# Patient Record
Sex: Female | Born: 1965
Health system: Southern US, Community
[De-identification: ages and names within clinical notes are randomized; demographics above are authoritative.]

## PROBLEM LIST (undated history)

## (undated) DIAGNOSIS — T8859XA Other complications of anesthesia, initial encounter: Secondary | ICD-10-CM

## (undated) DIAGNOSIS — I10 Essential (primary) hypertension: Secondary | ICD-10-CM

## (undated) DIAGNOSIS — J189 Pneumonia, unspecified organism: Secondary | ICD-10-CM

## (undated) DIAGNOSIS — T4145XA Adverse effect of unspecified anesthetic, initial encounter: Secondary | ICD-10-CM

## (undated) DIAGNOSIS — T7840XA Allergy, unspecified, initial encounter: Secondary | ICD-10-CM

## (undated) DIAGNOSIS — R7611 Nonspecific reaction to tuberculin skin test without active tuberculosis: Secondary | ICD-10-CM

## (undated) DIAGNOSIS — F419 Anxiety disorder, unspecified: Secondary | ICD-10-CM

## (undated) DIAGNOSIS — D72829 Elevated white blood cell count, unspecified: Secondary | ICD-10-CM

## (undated) DIAGNOSIS — R112 Nausea with vomiting, unspecified: Secondary | ICD-10-CM

## (undated) DIAGNOSIS — Z9889 Other specified postprocedural states: Secondary | ICD-10-CM

## (undated) HISTORY — DX: Elevated white blood cell count, unspecified: D72.829

## (undated) HISTORY — PX: BIOPSY BREAST: PRO8

## (undated) HISTORY — DX: Essential (primary) hypertension: I10

## (undated) HISTORY — DX: Nonspecific reaction to tuberculin skin test without active tuberculosis: R76.11

## (undated) HISTORY — DX: Allergy, unspecified, initial encounter: T78.40XA

## (undated) HISTORY — DX: Anxiety disorder, unspecified: F41.9

---
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
## 1997-11-30 ENCOUNTER — Other Ambulatory Visit: Admission: RE | Admit: 1997-11-30 | Discharge: 1997-11-30 | Payer: Self-pay | Admitting: Obstetrics and Gynecology

## 1998-04-22 ENCOUNTER — Encounter: Admission: RE | Admit: 1998-04-22 | Discharge: 1998-04-22 | Payer: Self-pay | Admitting: Infectious Diseases

## 1998-04-22 ENCOUNTER — Ambulatory Visit (HOSPITAL_COMMUNITY): Admission: RE | Admit: 1998-04-22 | Discharge: 1998-04-22 | Payer: Self-pay | Admitting: Infectious Diseases

## 1998-04-22 ENCOUNTER — Encounter: Payer: Self-pay | Admitting: Infectious Diseases

## 1998-12-03 ENCOUNTER — Other Ambulatory Visit: Admission: RE | Admit: 1998-12-03 | Discharge: 1998-12-03 | Payer: Self-pay | Admitting: Obstetrics and Gynecology

## 1999-12-30 ENCOUNTER — Other Ambulatory Visit: Admission: RE | Admit: 1999-12-30 | Discharge: 1999-12-30 | Payer: Self-pay | Admitting: Obstetrics and Gynecology

## 2001-07-18 ENCOUNTER — Other Ambulatory Visit: Admission: RE | Admit: 2001-07-18 | Discharge: 2001-07-18 | Payer: Self-pay | Admitting: *Deleted

## 2002-05-11 ENCOUNTER — Inpatient Hospital Stay (HOSPITAL_COMMUNITY): Admission: AD | Admit: 2002-05-11 | Discharge: 2002-05-13 | Payer: Self-pay | Admitting: Obstetrics and Gynecology

## 2002-06-17 ENCOUNTER — Other Ambulatory Visit: Admission: RE | Admit: 2002-06-17 | Discharge: 2002-06-17 | Payer: Self-pay | Admitting: Obstetrics and Gynecology

## 2003-07-08 ENCOUNTER — Other Ambulatory Visit: Admission: RE | Admit: 2003-07-08 | Discharge: 2003-07-08 | Payer: Self-pay | Admitting: *Deleted

## 2004-07-13 ENCOUNTER — Other Ambulatory Visit: Admission: RE | Admit: 2004-07-13 | Discharge: 2004-07-13 | Payer: Self-pay | Admitting: *Deleted

## 2004-09-07 ENCOUNTER — Ambulatory Visit: Payer: Self-pay | Admitting: Family Medicine

## 2004-09-15 ENCOUNTER — Ambulatory Visit: Payer: Self-pay | Admitting: Family Medicine

## 2004-10-10 ENCOUNTER — Ambulatory Visit: Payer: Self-pay | Admitting: Family Medicine

## 2004-11-18 ENCOUNTER — Ambulatory Visit: Payer: Self-pay | Admitting: Family Medicine

## 2004-12-23 ENCOUNTER — Ambulatory Visit: Payer: Self-pay | Admitting: Family Medicine

## 2005-04-10 ENCOUNTER — Ambulatory Visit: Payer: Self-pay | Admitting: Internal Medicine

## 2005-07-19 ENCOUNTER — Other Ambulatory Visit: Admission: RE | Admit: 2005-07-19 | Discharge: 2005-07-19 | Payer: Self-pay | Admitting: *Deleted

## 2005-08-08 ENCOUNTER — Emergency Department (HOSPITAL_COMMUNITY): Admission: EM | Admit: 2005-08-08 | Discharge: 2005-08-09 | Payer: Self-pay | Admitting: Emergency Medicine

## 2006-01-30 ENCOUNTER — Ambulatory Visit: Payer: Self-pay | Admitting: Family Medicine

## 2006-01-30 LAB — CONVERTED CEMR LAB
ALT: 12 units/L (ref 0–40)
AST: 17 units/L (ref 0–37)
Albumin: 3.6 g/dL (ref 3.5–5.2)
Alkaline Phosphatase: 56 units/L (ref 39–117)
BUN: 8 mg/dL (ref 6–23)
CO2: 24 meq/L (ref 19–32)
Calcium: 9.2 mg/dL (ref 8.4–10.5)
Chloride: 106 meq/L (ref 96–112)
Chol/HDL Ratio, serum: 3.2
Cholesterol: 148 mg/dL (ref 0–200)
Creatinine, Ser: 0.8 mg/dL (ref 0.4–1.2)
Free T4: 0.8 ng/dL — ABNORMAL LOW (ref 0.9–1.8)
GFR calc non Af Amer: 85 mL/min
Glomerular Filtration Rate, Af Am: 103 mL/min/{1.73_m2}
Glucose, Bld: 93 mg/dL (ref 70–99)
HCT: 39.5 % (ref 36.0–46.0)
HDL: 46.3 mg/dL (ref >39.0)
Hemoglobin: 13.8 g/dL (ref 12.0–15.0)
LDL Cholesterol: 85 mg/dL (ref 0–99)
MCHC: 34.9 g/dL (ref 30.0–36.0)
MCV: 87.1 fL (ref 78.0–100.0)
Platelets: 441 10*3/uL — ABNORMAL HIGH (ref 150–400)
Potassium: 4.1 meq/L (ref 3.5–5.1)
RBC: 4.54 M/uL (ref 3.87–5.11)
RDW: 11.7 % (ref 11.5–14.6)
Sodium: 138 meq/L (ref 135–145)
TSH: 0.8 microintl units/mL (ref 0.35–5.50)
Total Bilirubin: 0.5 mg/dL (ref 0.3–1.2)
Total Protein: 6.9 g/dL (ref 6.0–8.3)
Triglyceride fasting, serum: 84 mg/dL (ref 0–149)
VLDL: 17 mg/dL (ref 0–40)
WBC: 15.7 10*3/uL — ABNORMAL HIGH (ref 4.5–10.5)

## 2006-07-30 ENCOUNTER — Ambulatory Visit: Payer: Self-pay | Admitting: Family Medicine

## 2006-08-02 ENCOUNTER — Other Ambulatory Visit: Admission: RE | Admit: 2006-08-02 | Discharge: 2006-08-02 | Payer: Self-pay | Admitting: *Deleted

## 2006-10-23 DIAGNOSIS — I10 Essential (primary) hypertension: Secondary | ICD-10-CM

## 2006-10-23 DIAGNOSIS — J309 Allergic rhinitis, unspecified: Secondary | ICD-10-CM | POA: Insufficient documentation

## 2006-10-23 DIAGNOSIS — Z9189 Other specified personal risk factors, not elsewhere classified: Secondary | ICD-10-CM

## 2006-10-23 DIAGNOSIS — Z87448 Personal history of other diseases of urinary system: Secondary | ICD-10-CM

## 2007-02-26 ENCOUNTER — Ambulatory Visit: Payer: Self-pay | Admitting: Family Medicine

## 2007-03-06 ENCOUNTER — Ambulatory Visit: Payer: Self-pay | Admitting: Family Medicine

## 2007-03-06 DIAGNOSIS — F411 Generalized anxiety disorder: Secondary | ICD-10-CM

## 2007-03-06 DIAGNOSIS — D72829 Elevated white blood cell count, unspecified: Secondary | ICD-10-CM | POA: Insufficient documentation

## 2007-03-07 ENCOUNTER — Ambulatory Visit: Payer: Self-pay | Admitting: Hematology & Oncology

## 2007-03-11 ENCOUNTER — Encounter: Payer: Self-pay | Admitting: Family Medicine

## 2007-03-11 LAB — CBC WITH DIFFERENTIAL/PLATELET
Basophils Absolute: 0.3 10*3/uL — ABNORMAL HIGH (ref 0.0–0.1)
EOS%: 2.5 % (ref 0.0–7.0)
HCT: 36.5 % (ref 34.8–46.6)
HGB: 13.1 g/dL (ref 11.6–15.9)
MCH: 30.9 pg (ref 26.0–34.0)
MCV: 86.2 fL (ref 81.0–101.0)
MONO%: 3.9 % (ref 0.0–13.0)
NEUT%: 55.7 % (ref 39.6–76.8)
lymph#: 4.9 10*3/uL — ABNORMAL HIGH (ref 0.9–3.3)

## 2007-03-11 LAB — ERYTHROCYTE SEDIMENTATION RATE: Sed Rate: 5 mm/hr (ref 0–30)

## 2007-03-26 LAB — JAK2 GENOTYPR: JAK2 GenotypR: NEGATIVE

## 2007-05-29 ENCOUNTER — Ambulatory Visit: Payer: Self-pay | Admitting: Family Medicine

## 2007-05-29 DIAGNOSIS — J019 Acute sinusitis, unspecified: Secondary | ICD-10-CM | POA: Insufficient documentation

## 2007-08-07 ENCOUNTER — Other Ambulatory Visit: Admission: RE | Admit: 2007-08-07 | Discharge: 2007-08-07 | Payer: Self-pay | Admitting: Gynecology

## 2007-09-09 ENCOUNTER — Ambulatory Visit: Payer: Self-pay | Admitting: Hematology & Oncology

## 2007-09-11 ENCOUNTER — Encounter: Payer: Self-pay | Admitting: Family Medicine

## 2007-09-11 LAB — CBC WITH DIFFERENTIAL/PLATELET
Basophils Absolute: 0.2 10*3/uL — ABNORMAL HIGH (ref 0.0–0.1)
Eosinophils Absolute: 0.6 10*3/uL — ABNORMAL HIGH (ref 0.0–0.5)
HCT: 38.5 % (ref 34.8–46.6)
LYMPH%: 30.5 % (ref 14.0–48.0)
MONO#: 0.7 10*3/uL (ref 0.1–0.9)
NEUT#: 8.7 10*3/uL — ABNORMAL HIGH (ref 1.5–6.5)
NEUT%: 59.2 % (ref 39.6–76.8)
Platelets: 392 10*3/uL (ref 145–400)
WBC: 14.6 10*3/uL — ABNORMAL HIGH (ref 3.9–10.0)

## 2008-04-09 ENCOUNTER — Ambulatory Visit: Payer: Self-pay | Admitting: Hematology & Oncology

## 2008-04-24 ENCOUNTER — Encounter: Payer: Self-pay | Admitting: Family Medicine

## 2008-04-24 LAB — CBC WITH DIFFERENTIAL (CANCER CENTER ONLY)
BASO#: 0.1 10*3/uL (ref 0.0–0.2)
EOS%: 3.9 % (ref 0.0–7.0)
HCT: 38.3 % (ref 34.8–46.6)
HGB: 13 g/dL (ref 11.6–15.9)
LYMPH#: 3.3 10*3/uL (ref 0.9–3.3)
MONO#: 0.6 10*3/uL (ref 0.1–0.9)
NEUT#: 6.5 10*3/uL (ref 1.5–6.5)
NEUT%: 59.2 % (ref 39.6–80.0)
RBC: 4.5 10*6/uL (ref 3.70–5.32)
WBC: 10.9 10*3/uL — ABNORMAL HIGH (ref 3.9–10.0)

## 2008-08-10 ENCOUNTER — Ambulatory Visit: Payer: Self-pay | Admitting: Family Medicine

## 2008-08-10 DIAGNOSIS — F411 Generalized anxiety disorder: Secondary | ICD-10-CM | POA: Insufficient documentation

## 2008-08-14 ENCOUNTER — Telehealth: Payer: Self-pay | Admitting: Family Medicine

## 2008-08-18 ENCOUNTER — Ambulatory Visit: Payer: Self-pay | Admitting: Family Medicine

## 2008-08-18 DIAGNOSIS — R000 Tachycardia, unspecified: Secondary | ICD-10-CM

## 2008-09-25 ENCOUNTER — Encounter: Payer: Self-pay | Admitting: Family Medicine

## 2008-10-30 ENCOUNTER — Telehealth (INDEPENDENT_AMBULATORY_CARE_PROVIDER_SITE_OTHER): Payer: Self-pay | Admitting: *Deleted

## 2008-10-30 ENCOUNTER — Telehealth: Payer: Self-pay | Admitting: Family Medicine

## 2008-12-22 ENCOUNTER — Ambulatory Visit: Payer: Self-pay | Admitting: Family Medicine

## 2008-12-22 DIAGNOSIS — H1045 Other chronic allergic conjunctivitis: Secondary | ICD-10-CM

## 2009-02-11 ENCOUNTER — Ambulatory Visit: Payer: Self-pay | Admitting: Family Medicine

## 2009-02-11 DIAGNOSIS — R05 Cough: Secondary | ICD-10-CM

## 2009-02-11 DIAGNOSIS — J209 Acute bronchitis, unspecified: Secondary | ICD-10-CM

## 2009-05-26 ENCOUNTER — Ambulatory Visit: Payer: Self-pay | Admitting: Family Medicine

## 2009-05-26 DIAGNOSIS — N3000 Acute cystitis without hematuria: Secondary | ICD-10-CM

## 2009-05-26 DIAGNOSIS — E663 Overweight: Secondary | ICD-10-CM | POA: Insufficient documentation

## 2009-05-26 LAB — CONVERTED CEMR LAB
Bilirubin Urine: NEGATIVE
Glucose, Urine, Semiquant: NEGATIVE
Ketones, urine, test strip: NEGATIVE
Nitrite: NEGATIVE
Protein, U semiquant: NEGATIVE
Specific Gravity, Urine: 1.01
Urobilinogen, UA: 0.2
pH: 6

## 2009-07-09 ENCOUNTER — Ambulatory Visit: Payer: Self-pay | Admitting: Family Medicine

## 2009-07-09 LAB — CONVERTED CEMR LAB
Bilirubin Urine: NEGATIVE
Blood in Urine, dipstick: NEGATIVE
Glucose, Urine, Semiquant: NEGATIVE
Ketones, urine, test strip: NEGATIVE
Nitrite: NEGATIVE
Protein, U semiquant: NEGATIVE
Specific Gravity, Urine: 1.015
Urobilinogen, UA: 0.2
WBC Urine, dipstick: NEGATIVE
pH: 8.5

## 2009-07-13 LAB — CONVERTED CEMR LAB
ALT: 28 units/L (ref 0–35)
AST: 28 units/L (ref 0–37)
Albumin: 4.1 g/dL (ref 3.5–5.2)
Alkaline Phosphatase: 65 units/L (ref 39–117)
BUN: 10 mg/dL (ref 6–23)
Basophils Absolute: 0.1 10*3/uL (ref 0.0–0.1)
Basophils Relative: 0.9 % (ref 0.0–3.0)
Bilirubin, Direct: 0.1 mg/dL (ref 0.0–0.3)
CO2: 27 meq/L (ref 19–32)
Calcium: 9 mg/dL (ref 8.4–10.5)
Chloride: 104 meq/L (ref 96–112)
Cholesterol: 133 mg/dL (ref 0–200)
Creatinine, Ser: 0.7 mg/dL (ref 0.4–1.2)
Eosinophils Absolute: 0.4 10*3/uL (ref 0.0–0.7)
Eosinophils Relative: 3.8 % (ref 0.0–5.0)
GFR calc non Af Amer: 97.15 mL/min (ref >60)
Glucose, Bld: 99 mg/dL (ref 70–99)
HCT: 40.1 % (ref 36.0–46.0)
HDL: 44.4 mg/dL (ref >39.00)
Hemoglobin: 13.8 g/dL (ref 12.0–15.0)
LDL Cholesterol: 76 mg/dL (ref 0–99)
Lymphocytes Relative: 33.3 % (ref 12.0–46.0)
Lymphs Abs: 3.7 10*3/uL (ref 0.7–4.0)
MCHC: 34.4 g/dL (ref 30.0–36.0)
MCV: 89.9 fL (ref 78.0–100.0)
Monocytes Absolute: 0.7 10*3/uL (ref 0.1–1.0)
Monocytes Relative: 6.3 % (ref 3.0–12.0)
Neutro Abs: 6.2 10*3/uL (ref 1.4–7.7)
Neutrophils Relative %: 55.7 % (ref 43.0–77.0)
Platelets: 391 10*3/uL (ref 150.0–400.0)
Potassium: 4.1 meq/L (ref 3.5–5.1)
RBC: 4.46 M/uL (ref 3.87–5.11)
RDW: 13.2 % (ref 11.5–14.6)
Sodium: 139 meq/L (ref 135–145)
TSH: 1.42 microintl units/mL (ref 0.35–5.50)
Total Bilirubin: 0.7 mg/dL (ref 0.3–1.2)
Total CHOL/HDL Ratio: 3
Total Protein: 6.8 g/dL (ref 6.0–8.3)
Triglycerides: 63 mg/dL (ref 0.0–149.0)
VLDL: 12.6 mg/dL (ref 0.0–40.0)
WBC: 11.2 10*3/uL — ABNORMAL HIGH (ref 4.5–10.5)

## 2009-07-16 ENCOUNTER — Ambulatory Visit: Payer: Self-pay | Admitting: Family Medicine

## 2009-09-27 ENCOUNTER — Telehealth: Payer: Self-pay | Admitting: Family Medicine

## 2009-09-28 ENCOUNTER — Encounter: Payer: Self-pay | Admitting: Family Medicine

## 2009-10-27 ENCOUNTER — Encounter: Payer: Self-pay | Admitting: Family Medicine

## 2010-05-01 LAB — CONVERTED CEMR LAB
ALT: 12 units/L (ref 0–35)
AST: 23 units/L (ref 0–37)
Albumin: 3.4 g/dL — ABNORMAL LOW (ref 3.5–5.2)
Alkaline Phosphatase: 73 units/L (ref 39–117)
BUN: 14 mg/dL (ref 6–23)
Basophils Absolute: 0.1 10*3/uL (ref 0.0–0.1)
Basophils Relative: 0.5 % (ref 0.0–3.0)
Bilirubin, Direct: 0.1 mg/dL (ref 0.0–0.3)
CO2: 27 meq/L (ref 19–32)
Calcium: 9 mg/dL (ref 8.4–10.5)
Chloride: 103 meq/L (ref 96–112)
Creatinine, Ser: 0.7 mg/dL (ref 0.4–1.2)
Eosinophils Absolute: 0.2 10*3/uL (ref 0.0–0.7)
Eosinophils Relative: 1.5 % (ref 0.0–5.0)
GFR calc non Af Amer: 97.57 mL/min (ref >60)
Glucose, Bld: 116 mg/dL — ABNORMAL HIGH (ref 70–99)
HCT: 39.3 % (ref 36.0–46.0)
Hemoglobin: 13.8 g/dL (ref 12.0–15.0)
Lymphocytes Relative: 34.7 % (ref 12.0–46.0)
Lymphs Abs: 4.5 10*3/uL — ABNORMAL HIGH (ref 0.7–4.0)
MCHC: 35.1 g/dL (ref 30.0–36.0)
MCV: 87 fL (ref 78.0–100.0)
Monocytes Absolute: 0.5 10*3/uL (ref 0.1–1.0)
Monocytes Relative: 3.6 % (ref 3.0–12.0)
Neutro Abs: 7.6 10*3/uL (ref 1.4–7.7)
Neutrophils Relative %: 59.7 % (ref 43.0–77.0)
Platelets: 375 10*3/uL (ref 150.0–400.0)
Potassium: 3.3 meq/L — ABNORMAL LOW (ref 3.5–5.1)
RBC: 4.52 M/uL (ref 3.87–5.11)
RDW: 11.9 % (ref 11.5–14.6)
Sodium: 137 meq/L (ref 135–145)
TSH: 1.56 microintl units/mL (ref 0.35–5.50)
Total Bilirubin: 0.5 mg/dL (ref 0.3–1.2)
Total Protein: 6.9 g/dL (ref 6.0–8.3)
WBC: 12.9 10*3/uL — ABNORMAL HIGH (ref 4.5–10.5)

## 2010-05-05 NOTE — Progress Notes (Signed)
Summary: swelling  Phone Note Call from Patient   Caller: Patient Call For: Nelwyn Salisbury MD Summary of Call: Pt is complaining of swelling on Amlodipine and would like a diuretic. [facility] [phone]  Initial call taken by: Lynann Beaver CMA,  September 27, 2009 3:02 PM  Follow-up for Phone Call        call in HCTZ 12.5 mg once daily , #30 with 11 rf Follow-up by: Nelwyn Salisbury MD,  September 27, 2009 5:09 PM    New/Updated Medications: HYDROCHLOROTHIAZIDE 12.5 MG CAPS (HYDROCHLOROTHIAZIDE) 1 once daily Prescriptions: HYDROCHLOROTHIAZIDE 12.5 MG CAPS (HYDROCHLOROTHIAZIDE) 1 once daily  #30 x 11   Entered by:   Raechel Ache, RN   Authorized by:   Nelwyn Salisbury MD   Signed by:   Raechel Ache, RN on 09/27/2009   Method used:   Electronically to        [facility]* (retail)       [address] [place], [place]  [address]       Ph: [phone] or [phone]       Fax: [phone]   RxID:   [id number]

## 2010-05-05 NOTE — Assessment & Plan Note (Signed)
Summary: ? UTI//CCM   Vital Signs:  Patient profile:   45 year old female Weight:      188 pounds BMI:     34.51 Temp:     98.5 degrees F oral Pulse rate:   89 / minute BP sitting:   122 / 84  (left arm) Cuff size:   large  Vitals Entered By: Alfred Levins, CMA (May 26, 2009 3:23 PM) CC: uti, bp check   History of Present Illness: Here to check BP, to ask about weight loss options, and for a possible UTI. For 3 days she has had urgency to urinate with some lower back pressure. No fever or nausea. Drinking water. She is dieting and walking every day but cannot lose weight. Would like to try an appetite suppressant.   Current Medications (verified): 1)  Yaz 3-0.02 Mg  Tabs (Drospirenone-Ethinyl Estradiol) .Marland Kitchen.. 1 By Mouth Once Daily 2)  Rhinocort Aqua 32 Mcg/act Susp (Budesonide (Nasal)) .... 2 Sprays Each Nostril Once Daily 3)  Multivitamins   Tabs (Multiple Vitamin) .Marland Kitchen.. 1 By Mouth Once Daily 4)  Celexa 40 Mg Tabs (Citalopram Hydrobromide) .... Once Daily 5)  Klor-Con M20 20 Meq Cr-Tabs (Potassium Chloride Crys Cr) .Marland Kitchen.. 1 By Mouth Once Daily 6)  Flonase 50 Mcg/act Susp (Fluticasone Propionate) .... 2 Sprays Each Nostril Once Daily 7)  Amlodipine Besylate 5 Mg Tabs (Amlodipine Besylate) .... Once Daily  Allergies (verified): 1)  ! Lisinopril  Past History:  Past Medical History: Reviewed history from 08/10/2008 and no changes required. Allergic rhinitis Hypertension benign chronic leukocytosis, saw Dr. Myna Hidalgo. No follow up needed.  Anxiety  Review of Systems  The patient denies anorexia, fever, weight loss, weight gain, vision loss, decreased hearing, hoarseness, chest pain, syncope, dyspnea on exertion, peripheral edema, prolonged cough, headaches, hemoptysis, abdominal pain, melena, hematochezia, severe indigestion/heartburn, hematuria, incontinence, genital sores, muscle weakness, suspicious skin lesions, transient blindness, difficulty walking, depression, unusual  weight change, abnormal bleeding, enlarged lymph nodes, angioedema, breast masses, and testicular masses.    Physical Exam  General:  overweight-appearing.   Neck:  No deformities, masses, or tenderness noted. Lungs:  Normal respiratory effort, chest expands symmetrically. Lungs are clear to auscultation, no crackles or wheezes. Heart:  Normal rate and regular rhythm. S1 and S2 normal without gallop, murmur, click, rub or other extra sounds. Abdomen:  Bowel sounds positive,abdomen soft and non-tender without masses, organomegaly or hernias noted.   Impression & Recommendations:  Problem # 1:  ACUTE CYSTITIS (ICD-595.0)  Her updated medication list for this problem includes:    Macrobid 100 Mg Caps (Nitrofurantoin monohyd macro) .Marland Kitchen..Marland Kitchen Two times a day  Problem # 2:  OVERWEIGHT (ICD-278.02)  Problem # 3:  HYPERTENSION (ICD-401.9)  Her updated medication list for this problem includes:    Amlodipine Besylate 5 Mg Tabs (Amlodipine besylate) ..... Once daily  Complete Medication List: 1)  Yaz 3-0.02 Mg Tabs (Drospirenone-ethinyl estradiol) .Marland Kitchen.. 1 by mouth once daily 2)  Rhinocort Aqua 32 Mcg/act Susp (Budesonide (nasal)) .... 2 sprays each nostril once daily 3)  Multivitamins Tabs (Multiple vitamin) .Marland Kitchen.. 1 by mouth once daily 4)  Celexa 40 Mg Tabs (Citalopram hydrobromide) .... Once daily 5)  Klor-con M20 20 Meq Cr-tabs (Potassium chloride crys cr) .Marland Kitchen.. 1 by mouth once daily 6)  Flonase 50 Mcg/act Susp (Fluticasone propionate) .... 2 sprays each nostril once daily 7)  Amlodipine Besylate 5 Mg Tabs (Amlodipine besylate) .... Once daily 8)  Macrobid 100 Mg Caps (Nitrofurantoin monohyd macro) .... Two times  a day 9)  Phentermine Hcl 30 Mg Caps (Phentermine hcl) .... Once daily  Other Orders: UA Dipstick w/o Micro (manual) (04540)  Patient Instructions: 1)  set up a cpx with labs soon Prescriptions: MACROBID 100 MG CAPS (NITROFURANTOIN MONOHYD MACRO) two times a day  #14 x 0    Entered and Authorized by:   Nelwyn Salisbury MD   Signed by:   Nelwyn Salisbury MD on 05/26/2009   Method used:   Print then Give to Patient   RxID:   [id number] PHENTERMINE HCL 30 MG CAPS (PHENTERMINE HCL) once daily  #30 x 5   Entered and Authorized by:   Nelwyn Salisbury MD   Signed by:   Nelwyn Salisbury MD on 05/26/2009   Method used:   Print then Give to Patient   RxID:   [id number]   Laboratory Results   Urine Tests    Routine Urinalysis   Color: yellow Appearance: Clear Glucose: negative   (Normal Range: Negative) Bilirubin: negative   (Normal Range: Negative) Ketone: negative   (Normal Range: Negative) Spec. Gravity: 1.010   (Normal Range: 1.003-1.035) Blood: trace-lysed   (Normal Range: Negative) pH: 6.0   (Normal Range: 5.0-8.0) Protein: negative   (Normal Range: Negative) Urobilinogen: 0.2   (Normal Range: 0-1) Nitrite: negative   (Normal Range: Negative) Leukocyte Esterace: trace   (Normal Range: Negative)    Comments: Rita Ohara  May 26, 2009 4:00 PM

## 2010-05-05 NOTE — Assessment & Plan Note (Signed)
Summary: cpx/no pap/njr   Vital Signs:  Patient profile:   45 year old female Height:      62 inches Weight:      181 pounds BMI:     33.22 BP sitting:   110 / 82  (left arm) Cuff size:   regular  Vitals Entered By: Raechel Ache, RN (July 16, 2009 9:08 AM) CC: CPX, labs done. Sees gyn.   History of Present Illness: 45 yr old female for a cpx. She feels great, watches her diet and walks 3 days a week with a friend. She would like to try a higher dose of phentermine if possible.   Preventive Screening-Counseling & Management  Alcohol-Tobacco     Smoking Status: never  Allergies: 1)  ! Lisinopril  Past History:  Past Medical History: Allergic rhinitis Hypertension benign chronic leukocytosis, saw Dr. Myna Hidalgo. No follow up needed.  Anxiety sees Thurston Pounds PA at [facility] for GYN exams positive PPD, treated with INH for 6 months  Past Surgical History: breast biopsy 1996, benign  Family History: Reviewed history and no changes required. Family History Breast cancer 1st degree relative <50 Family History of CAD Female 1st degree relative <50 Family History Diabetes 1st degree relative Family History High cholesterol Family History Hypertension Family History Lung cancer Family History of Prostate CA 1st degree relative <50 Family History of Stroke M 1st degree relative <50  Social History: Reviewed history and no changes required. Married Never Smoked Alcohol use-yes homemaker Smoking Status:  never  Review of Systems  The patient denies anorexia, fever, weight loss, weight gain, vision loss, decreased hearing, hoarseness, chest pain, syncope, dyspnea on exertion, peripheral edema, prolonged cough, headaches, hemoptysis, abdominal pain, melena, hematochezia, severe indigestion/heartburn, hematuria, incontinence, genital sores, muscle weakness, suspicious skin lesions, transient blindness, difficulty walking, depression, unusual weight change,  abnormal bleeding, enlarged lymph nodes, angioedema, breast masses, and testicular masses.    Physical Exam  General:  overweight-appearing.   Head:  Normocephalic and atraumatic without obvious abnormalities. No apparent alopecia or balding. Eyes:  No corneal or conjunctival inflammation noted. EOMI. Perrla. Funduscopic exam benign, without hemorrhages, exudates or papilledema. Vision grossly normal. Ears:  External ear exam shows no significant lesions or deformities.  Otoscopic examination reveals clear canals, tympanic membranes are intact bilaterally without bulging, retraction, inflammation or discharge. Hearing is grossly normal bilaterally. Nose:  External nasal examination shows no deformity or inflammation. Nasal mucosa are pink and moist without lesions or exudates. Mouth:  Oral mucosa and oropharynx without lesions or exudates.  Teeth in good repair. Neck:  No deformities, masses, or tenderness noted. Chest Wall:  No deformities, masses, or tenderness noted. Lungs:  Normal respiratory effort, chest expands symmetrically. Lungs are clear to auscultation, no crackles or wheezes. Heart:  Normal rate and regular rhythm. S1 and S2 normal without gallop, murmur, click, rub or other extra sounds. Abdomen:  Bowel sounds positive,abdomen soft and non-tender without masses, organomegaly or hernias noted. Msk:  No deformity or scoliosis noted of thoracic or lumbar spine.   Pulses:  R and L carotid,radial,femoral,dorsalis pedis and posterior tibial pulses are full and equal bilaterally Extremities:  No clubbing, cyanosis, edema, or deformity noted with normal full range of motion of all joints.   Neurologic:  No cranial nerve deficits noted. Station and gait are normal. Plantar reflexes are down-going bilaterally. DTRs are symmetrical throughout. Sensory, motor and coordinative functions appear intact. Skin:  Intact without suspicious lesions or rashes Cervical Nodes:  No lymphadenopathy  noted Axillary Nodes:  No palpable lymphadenopathy Inguinal Nodes:  No significant adenopathy Psych:  Cognition and judgment appear intact. Alert and cooperative with normal attention span and concentration. No apparent delusions, illusions, hallucinations   Impression & Recommendations:  Problem # 1:  HEALTH MAINTENANCE EXAM (ICD-V70.0)  Complete Medication List: 1)  Multivitamins Tabs (Multiple vitamin) .Marland Kitchen.. 1 by mouth once daily 2)  Celexa 40 Mg Tabs (Citalopram hydrobromide) .... Once daily 3)  Klor-con M20 20 Meq Cr-tabs (Potassium chloride crys cr) .Marland Kitchen.. 1 by mouth once daily 4)  Amlodipine Besylate 5 Mg Tabs (Amlodipine besylate) .... Once daily 5)  Nora-be 0.35 Mg Tabs (Norethindrone) .Marland Kitchen.. 1 once daily 6)  Phentermine Hcl 37.5 Mg Caps (Phentermine hcl) .... Once daily  Other Orders: Tdap => 37yrs IM (44034) Admin 1st Vaccine (74259)  Patient Instructions: 1)  Please schedule a follow-up appointment in 6 months .  Prescriptions: PHENTERMINE HCL 37.5 MG CAPS (PHENTERMINE HCL) once daily  #30 x 5   Entered and Authorized by:   Nelwyn Salisbury MD   Signed by:   Nelwyn Salisbury MD on 07/16/2009   Method used:   Print then Give to Patient   RxID:   [id number] AMLODIPINE BESYLATE 5 MG TABS (AMLODIPINE BESYLATE) once daily  #30 x 11   Entered and Authorized by:   Nelwyn Salisbury MD   Signed by:   Nelwyn Salisbury MD on 07/16/2009   Method used:   Electronically to        [facility]* (retail)       [address] [place], [place]  [address]       Ph: [phone] or [phone]       Fax: [phone]   RxID:   [id number] KLOR-CON M20 20 MEQ CR-TABS (POTASSIUM CHLORIDE CRYS CR) 1 by mouth once daily  #30 x 11   Entered and Authorized by:   Nelwyn Salisbury MD   Signed by:   Nelwyn Salisbury MD on 07/16/2009   Method used:   Electronically to        [facility]* (retail)       [address]       [place], [place]  [address]       Ph: [phone] or  [phone]       Fax: [phone]   RxID:   [id number] CELEXA 40 MG TABS (CITALOPRAM HYDROBROMIDE) once daily  #30 x 11   Entered and Authorized by:   Nelwyn Salisbury MD   Signed by:   Nelwyn Salisbury MD on 07/16/2009   Method used:   Electronically to        [facility]* (retail)       [address]       [place], [place]  [address]       Ph: [phone] or [phone]       Fax: [phone]   RxID:   [id number]    Immunizations Administered:  Tetanus Vaccine:    Vaccine Type: Tdap    Site: right deltoid    Mfr: GlaxoSmithKline    Dose: 0.5 ml    Route: IM    Given by: Raechel Ache, RN    Exp. Date: 06/26/2011    Lot #: [id number]    VIS given: 02/19/07 version given July 16, 2009.

## 2010-05-05 NOTE — Letter (Signed)
Summary: Health Examination Certificate for [facility] Examination Certificate for School   Imported By: Maryln Gottron 10/29/2009 09:41:08  _____________________________________________________________________  External Attachment:    Type:   Image     Comment:   External Document

## 2010-08-04 ENCOUNTER — Other Ambulatory Visit: Payer: Self-pay | Admitting: Family Medicine

## 2010-08-19 NOTE — Discharge Summary (Signed)
   NAME:  Kari Lewis, Kari Lewis                     ACCOUNT NO.:  [id number]   MEDICAL RECORD NO.:  [id number]                   PATIENT TYPE:  INP   LOCATION:  9103                                 FACILITY:  [facility]   PHYSICIAN:  Gerrit Friends. Aldona Bar, M.D.                DATE OF BIRTH:  06/30/1965   DATE OF ADMISSION:  05/11/2002  DATE OF DISCHARGE:  05/13/2002                                 DISCHARGE SUMMARY   DISCHARGE DIAGNOSES:  1. A 37 to 38 intrauterine pregnancy delivered, 6 pound 10 ounce female,     Apgar's of 8 and 9.  2. Blood type O positive.   PROCEDURES:  1. Normal spontaneous delivery.  2. First degree tear and repair.   HOSPITAL COURSE:  This 45 year old gravida 2, para 0, was admitted at 7 to  [redacted] weeks gestation in early labor after a benign pregnancy.  She did undergo  an amniocentesis because of advanced maternal age with normal findings.  At  time of admission, her membranes were ruptured, fluid was clear, she  progressed to labor, and had a subsequent normal spontaneous delivery of a  viable female infant with Apgar's of 8 and 9 over a first degree tear which  was repaired without difficulty.  The baby's weight was 6 pounds 10 ounces.  The patient's postpartum course was benign.  On the morning of 05/13/02,  mother was ambulating well, tolerating a regular diet well, having normal  bowel and bladder function, was bottle feeding without difficulty, was  afebrile, and was comfortable.  Vital signs were stable, and she was ready  for discharge.  Accordingly, she was given all her discharge instructions as  well as followup.   LABORATORY DATA:  Discharge hemoglobin 11.3, white blood cell count 17,000,  platelet count 361,000.   She was given all of her instructions at the time of discharge.   DISCHARGE MEDICATIONS:  1. Vitamins one daily until done.  2. Motrin 600 mg q.6h. p.r.n. pain.   CONDITION ON DISCHARGE:  Improved.           Gerrit Friends. Aldona Bar, M.D.    RMW/MEDQ  D:  05/13/2002  T:  05/13/2002  Job:  [id number]

## 2010-08-31 ENCOUNTER — Other Ambulatory Visit: Payer: Self-pay | Admitting: Family Medicine

## 2010-09-27 ENCOUNTER — Other Ambulatory Visit: Payer: Self-pay | Admitting: Family Medicine

## 2010-10-11 ENCOUNTER — Encounter: Payer: Self-pay | Admitting: Family Medicine

## 2010-10-27 ENCOUNTER — Other Ambulatory Visit: Payer: Self-pay | Admitting: Internal Medicine

## 2010-11-26 ENCOUNTER — Other Ambulatory Visit: Payer: Self-pay | Admitting: Family Medicine

## 2010-11-29 NOTE — Telephone Encounter (Signed)
Last OV 07/16/09. Last filled 10/27/10

## 2010-12-12 ENCOUNTER — Ambulatory Visit: Payer: Self-pay | Admitting: Family Medicine

## 2010-12-19 ENCOUNTER — Ambulatory Visit (INDEPENDENT_AMBULATORY_CARE_PROVIDER_SITE_OTHER): Payer: BC Managed Care – PPO | Admitting: Family Medicine

## 2010-12-19 ENCOUNTER — Encounter: Payer: Self-pay | Admitting: Family Medicine

## 2010-12-19 VITALS — BP 116/80 | HR 91 | Temp 98.4°F | Wt 197.0 lb

## 2010-12-19 DIAGNOSIS — Z23 Encounter for immunization: Secondary | ICD-10-CM

## 2010-12-19 DIAGNOSIS — F419 Anxiety disorder, unspecified: Secondary | ICD-10-CM

## 2010-12-19 DIAGNOSIS — I10 Essential (primary) hypertension: Secondary | ICD-10-CM

## 2010-12-19 DIAGNOSIS — F411 Generalized anxiety disorder: Secondary | ICD-10-CM

## 2010-12-19 MED ORDER — DULOXETINE HCL 60 MG PO CPEP: 60.0000 mg | ORAL_CAPSULE | Freq: Every day | ORAL | Status: DC

## 2010-12-19 MED ORDER — POTASSIUM CHLORIDE 20 MEQ PO PACK: 20.0000 meq | PACK | Freq: Every day | ORAL | Status: DC

## 2010-12-19 MED ORDER — HYDROCHLOROTHIAZIDE 25 MG PO TABS: 25.0000 mg | ORAL_TABLET | Freq: Every day | ORAL | Status: DC

## 2010-12-19 MED ORDER — AMLODIPINE BESYLATE 5 MG PO TABS: 5.0000 mg | ORAL_TABLET | Freq: Every day | ORAL | Status: DC

## 2010-12-19 NOTE — Progress Notes (Signed)
Addended by: Aniceto Boss A on: 12/19/2010 09:36 AM   Modules accepted: Orders

## 2010-12-19 NOTE — Progress Notes (Signed)
  Subjective:    Patient ID: Kari Lewis, female    DOB: Dec 14, 1965, 45 y.o.   MRN: [id number]  HPI Here to follow up on HTN and anxiety. Her BP is stable. She has been on Celexa a long time, and she thinks it is not working as well lately. She has mood swing and can have a short temper. She sleeps well.    Review of Systems  Constitutional: Negative.   Respiratory: Negative.   Cardiovascular: Negative.   Psychiatric/Behavioral: Negative for dysphoric mood and decreased concentration. The patient is nervous/anxious.        Objective:   Physical Exam  Constitutional: She appears well-developed and well-nourished.  Cardiovascular: Normal rate, regular rhythm, normal heart sounds and intact distal pulses.   Pulmonary/Chest: Effort normal and breath sounds normal.  Psychiatric: She has a normal mood and affect. Her behavior is normal. Thought content normal.          Assessment & Plan:  Switch to Cymbalta.

## 2010-12-28 ENCOUNTER — Encounter: Payer: Self-pay | Admitting: Family Medicine

## 2010-12-28 ENCOUNTER — Telehealth: Payer: Self-pay | Admitting: Family Medicine

## 2010-12-28 NOTE — Telephone Encounter (Signed)
Dr. Clent Ridges - this patient switched from Celexa to Cymbalta at her last appt with you. The Cymbalta was denied by her insurance company. They state that she must "try & fail 1 additional formulary alternative, unless contraindicated." Their formulary alternatives include: fluoxetine, paroxetine, paroxetine ER, venlafaxine, venlafaxine ER tabs, or venlafaxine ER caps.   Please document if these are contraindicated for this pt or call in a new Rx for one of the approved alternatives. Thanks!

## 2010-12-30 MED ORDER — VENLAFAXINE HCL ER 150 MG PO CP24: 150.0000 mg | ORAL_CAPSULE | Freq: Every day | ORAL | Status: DC

## 2010-12-30 NOTE — Telephone Encounter (Signed)
Script called in and left message for pt. 

## 2010-12-30 NOTE — Telephone Encounter (Signed)
Switch from Cymbalta to Venlafaxine ER 150 mg a day. Call in  #30 with 5 rf

## 2011-01-03 ENCOUNTER — Other Ambulatory Visit: Payer: Self-pay | Admitting: Family Medicine

## 2011-07-08 ENCOUNTER — Other Ambulatory Visit: Payer: Self-pay | Admitting: Family Medicine

## 2011-07-11 ENCOUNTER — Telehealth: Payer: Self-pay | Admitting: Family Medicine

## 2011-07-11 NOTE — Telephone Encounter (Signed)
Refill request for Effexor XR 50 mg take 1 po qd. I don't think that this was printed out?

## 2011-07-13 NOTE — Telephone Encounter (Signed)
She is on 150 mg a day, not 50. Please call in one year supply

## 2011-07-14 MED ORDER — VENLAFAXINE HCL ER 150 MG PO CP24: 150.0000 mg | ORAL_CAPSULE | Freq: Every day | ORAL | Status: DC

## 2011-07-14 NOTE — Telephone Encounter (Signed)
Script called in

## 2011-10-20 ENCOUNTER — Encounter: Payer: Self-pay | Admitting: Family Medicine

## 2011-12-05 ENCOUNTER — Other Ambulatory Visit: Payer: Self-pay | Admitting: *Deleted

## 2011-12-05 MED ORDER — AMLODIPINE BESYLATE 5 MG PO TABS: 5.0000 mg | ORAL_TABLET | Freq: Every day | ORAL | Status: DC

## 2011-12-06 ENCOUNTER — Telehealth: Payer: Self-pay | Admitting: Family Medicine

## 2011-12-06 MED ORDER — HYDROCHLOROTHIAZIDE 25 MG PO TABS: 25.0000 mg | ORAL_TABLET | Freq: Every day | ORAL | Status: DC

## 2011-12-06 NOTE — Telephone Encounter (Signed)
Refill request for HCTZ and I did send e-scribe.

## 2011-12-22 ENCOUNTER — Encounter: Payer: Self-pay | Admitting: Family Medicine

## 2011-12-22 ENCOUNTER — Ambulatory Visit (INDEPENDENT_AMBULATORY_CARE_PROVIDER_SITE_OTHER): Payer: BC Managed Care – PPO | Admitting: Family Medicine

## 2011-12-22 VITALS — BP 122/74 | HR 102 | Temp 98.1°F | Wt 200.0 lb

## 2011-12-22 DIAGNOSIS — Z23 Encounter for immunization: Secondary | ICD-10-CM

## 2011-12-22 DIAGNOSIS — F411 Generalized anxiety disorder: Secondary | ICD-10-CM

## 2011-12-22 DIAGNOSIS — R05 Cough: Secondary | ICD-10-CM

## 2011-12-22 DIAGNOSIS — I10 Essential (primary) hypertension: Secondary | ICD-10-CM

## 2011-12-22 LAB — CBC WITH DIFFERENTIAL/PLATELET
Basophils Absolute: 0.1 10*3/uL (ref 0.0–0.1)
Eosinophils Absolute: 0.4 10*3/uL (ref 0.0–0.7)
Lymphocytes Relative: 28.8 % (ref 12.0–46.0)
MCHC: 33.6 g/dL (ref 30.0–36.0)
Monocytes Relative: 6.6 % (ref 3.0–12.0)
Neutro Abs: 7.1 10*3/uL (ref 1.4–7.7)
Neutrophils Relative %: 60.5 % (ref 43.0–77.0)
Platelets: 417 10*3/uL — ABNORMAL HIGH (ref 150.0–400.0)
RDW: 13 % (ref 11.5–14.6)

## 2011-12-22 LAB — BASIC METABOLIC PANEL
CO2: 24 mEq/L (ref 19–32)
Calcium: 9 mg/dL (ref 8.4–10.5)
Creatinine, Ser: 0.6 mg/dL (ref 0.4–1.2)
Glucose, Bld: 93 mg/dL (ref 70–99)

## 2011-12-22 LAB — HEPATIC FUNCTION PANEL
Albumin: 4.2 g/dL (ref 3.5–5.2)
Total Protein: 7.6 g/dL (ref 6.0–8.3)

## 2011-12-22 LAB — LIPID PANEL
HDL: 39.2 mg/dL (ref >39.00)
Triglycerides: 130 mg/dL (ref 0.0–149.0)

## 2011-12-22 LAB — POCT URINALYSIS DIPSTICK
Ketones, UA: NEGATIVE
Nitrite, UA: NEGATIVE
Protein, UA: NEGATIVE
pH, UA: 7

## 2011-12-22 LAB — TSH: TSH: 1.3 u[IU]/mL (ref 0.35–5.50)

## 2011-12-22 MED ORDER — VARENICLINE TARTRATE 0.5 MG X 11 & 1 MG X 42 PO MISC: ORAL | Status: DC

## 2011-12-22 MED ORDER — VENLAFAXINE HCL ER 150 MG PO CP24: 150.0000 mg | ORAL_CAPSULE | Freq: Every day | ORAL | Status: DC

## 2011-12-22 MED ORDER — AMLODIPINE BESYLATE 5 MG PO TABS: 5.0000 mg | ORAL_TABLET | Freq: Every day | ORAL | Status: DC

## 2011-12-22 MED ORDER — HYDROCHLOROTHIAZIDE 25 MG PO TABS: 25.0000 mg | ORAL_TABLET | Freq: Every day | ORAL | Status: DC

## 2011-12-22 MED ORDER — VARENICLINE TARTRATE 1 MG PO TABS: 1.0000 mg | ORAL_TABLET | Freq: Two times a day (BID) | ORAL | Status: DC

## 2011-12-22 MED ORDER — POTASSIUM CHLORIDE ER 10 MEQ PO TBCR: 10.0000 meq | EXTENDED_RELEASE_TABLET | Freq: Every day | ORAL | Status: DC

## 2011-12-22 NOTE — Progress Notes (Signed)
Quick Note:  I sent script e-scribe ______ 

## 2011-12-22 NOTE — Progress Notes (Signed)
  Subjective:    Patient ID: Kari Lewis, female    DOB: [birth year], 46 y.o.   MRN: [id number]  HPI Here to follow up multiple issues. She feels well in general although she is having hot flashes from menopause. Her moods are good. She is not exercising.    Review of Systems  Constitutional: Negative.   Respiratory: Negative.   Cardiovascular: Negative.   Psychiatric/Behavioral: Negative.        Objective:   Physical Exam  Constitutional: She appears well-developed and well-nourished.  Neck: No thyromegaly present.  Cardiovascular: Normal rate, regular rhythm, normal heart sounds and intact distal pulses.   Pulmonary/Chest: Effort normal and breath sounds normal.  Lymphadenopathy:    She has no cervical adenopathy.  Psychiatric: She has a normal mood and affect. Her behavior is normal. Thought content normal.          Assessment & Plan:  Get fasting labs today. Wrote for Chantix to help her stop smoking. Get a CXR next week. She needs to exercise and lose weight.

## 2011-12-22 NOTE — Addendum Note (Signed)
Addended by: Aniceto Boss A on: 12/22/2011 03:14 PM   Modules accepted: Orders

## 2011-12-22 NOTE — Addendum Note (Signed)
Addended by: Aniceto Boss A on: 12/22/2011 10:16 AM   Modules accepted: Orders

## 2011-12-28 ENCOUNTER — Telehealth: Payer: Self-pay | Admitting: Family Medicine

## 2011-12-28 NOTE — Telephone Encounter (Signed)
We received notification that this pt did not complete chest films ordered 12/22/11.

## 2011-12-29 NOTE — Telephone Encounter (Signed)
That is okay. We can get this done another time

## 2012-04-08 ENCOUNTER — Other Ambulatory Visit: Payer: Self-pay | Admitting: Family Medicine

## 2012-05-07 ENCOUNTER — Other Ambulatory Visit: Payer: Self-pay | Admitting: Family Medicine

## 2012-08-27 ENCOUNTER — Other Ambulatory Visit: Payer: Self-pay | Admitting: Family Medicine

## 2012-12-09 ENCOUNTER — Encounter: Payer: Self-pay | Admitting: Family Medicine

## 2012-12-09 ENCOUNTER — Ambulatory Visit (INDEPENDENT_AMBULATORY_CARE_PROVIDER_SITE_OTHER): Payer: BC Managed Care – PPO | Admitting: Family Medicine

## 2012-12-09 VITALS — BP 128/76 | HR 106 | Temp 98.8°F | Wt 202.0 lb

## 2012-12-09 DIAGNOSIS — J209 Acute bronchitis, unspecified: Secondary | ICD-10-CM

## 2012-12-09 MED ORDER — AZITHROMYCIN 250 MG PO TABS: ORAL_TABLET | ORAL | Status: DC

## 2012-12-09 NOTE — Progress Notes (Signed)
  Subjective:    Patient ID: Kari Lewis, female    DOB: 1965-08-25, 47 y.o.   MRN: [id number]  HPI Here for 3 days of fever to 101 degrees, body aches, stuffy head, and coughing up green sputum.    Review of Systems  Constitutional: Positive for fever, chills and diaphoresis.  HENT: Positive for congestion and postnasal drip.   Eyes: Negative.   Respiratory: Positive for cough.   Cardiovascular: Negative.        Objective:   Physical Exam  Constitutional: She appears well-developed and well-nourished.  HENT:  Right Ear: External ear normal.  Left Ear: External ear normal.  Nose: Nose normal.  Mouth/Throat: Oropharynx is clear and moist.  Eyes: Conjunctivae are normal.  Pulmonary/Chest: Effort normal. No respiratory distress. She has no wheezes. She has no rales.  Scattered rhonchi   Lymphadenopathy:    She has no cervical adenopathy.          Assessment & Plan:  Add Mucinex

## 2013-01-04 ENCOUNTER — Other Ambulatory Visit: Payer: Self-pay | Admitting: Family Medicine

## 2013-01-06 ENCOUNTER — Telehealth: Payer: Self-pay | Admitting: Family Medicine

## 2013-01-06 MED ORDER — AMLODIPINE BESYLATE 5 MG PO TABS: 5.0000 mg | ORAL_TABLET | Freq: Every day | ORAL | Status: DC

## 2013-01-06 NOTE — Telephone Encounter (Signed)
done

## 2013-01-28 ENCOUNTER — Other Ambulatory Visit: Payer: Self-pay | Admitting: Family Medicine

## 2013-01-28 NOTE — Telephone Encounter (Signed)
Refill for one year 

## 2013-02-04 ENCOUNTER — Other Ambulatory Visit: Payer: Self-pay | Admitting: Family Medicine

## 2013-02-05 ENCOUNTER — Other Ambulatory Visit: Payer: Self-pay | Admitting: Family Medicine

## 2013-03-05 ENCOUNTER — Other Ambulatory Visit: Payer: Self-pay | Admitting: Family Medicine

## 2013-03-08 ENCOUNTER — Other Ambulatory Visit: Payer: Self-pay | Admitting: Family Medicine

## 2013-03-09 ENCOUNTER — Other Ambulatory Visit: Payer: Self-pay | Admitting: Family Medicine

## 2013-03-10 NOTE — Telephone Encounter (Signed)
Per Dr. Clent Ridges, okay to refill for 1 year, we can check potassium level next time pt comes in to office.

## 2013-04-06 ENCOUNTER — Other Ambulatory Visit: Payer: Self-pay | Admitting: Family Medicine

## 2013-04-09 ENCOUNTER — Telehealth: Payer: Self-pay | Admitting: Family Medicine

## 2013-04-09 NOTE — Telephone Encounter (Signed)
I spoke with pharmacy and script is ready for pick up, also left a message for pt.

## 2013-04-09 NOTE — Telephone Encounter (Signed)
[facility] requesting refill of amLODipine (NORVASC) 5 MG tablet

## 2013-06-04 ENCOUNTER — Other Ambulatory Visit: Payer: Self-pay | Admitting: Family Medicine

## 2013-07-17 ENCOUNTER — Telehealth: Payer: Self-pay | Admitting: Family Medicine

## 2013-07-17 NOTE — Telephone Encounter (Signed)
Per Dr. Sarajane Jews, we can see pt on Friday 07/18/13. I spoke with pt and she will schedule this. Also we recommended pt to try Zantac Pt should eat when she take the anti inflammatory medication.

## 2013-07-17 NOTE — Telephone Encounter (Signed)
Patient Information:  Caller Name: Samreen  Phone: [phone]  Patient: Kari Lewis  Gender: Female  DOB: Oct 09, 1965  Age: 48 Years  PCP: Alysia Penna [facility])  Pregnant: No  Office Follow Up:  Does the office need to follow up with this patient?: Yes  Instructions For The Office: Please call back with work in appointment time.  RN Note:  Mirena IUD. Called from work.  Reports chest discomfort for < 5 minutes 07/16/13 and "heartburn" 07/17/13 that resolved. Pulse 98.  Began unknown anti-inflammatory medication ordered by Dr Eddie Dibbles, orthopod on 07/15/13. No appointments remain with Dr Sarajane Jews.  Prefers to come in around 1100 if possible.  Message sent to provider for possible work in per protocol.   Symptoms  Reason For Call & Symptoms: Hypertension for 2 days.  BP 168/98 left arm at 0730. Feels "sluggish."  Reviewed Health History In EMR: Yes  Reviewed Medications In EMR: Yes  Reviewed Allergies In EMR: Yes  Reviewed Surgeries / Procedures: Yes  Date of Onset of Symptoms: 07/15/2013 OB / GYN:  LMP: Unknown  Guideline(s) Used:  High Blood Pressure  Disposition Per Guideline:   See Today in Office  Reason For Disposition Reached:   Patient wants to be seen  Advice Given:  General:  Untreated high blood pressure may cause damage to the heart, brain, kidneys, and eyes.  Treatment of high blood pressure can reduce the risk of stroke, heart attack, and heart failure.  The goal of blood pressure treatment for most patients with hypertension is to keep the blood pressure under 140/90.  Call Back If:  Headache, blurred vision, difficulty talking, or difficulty walking occurs  Chest pain or difficulty breathing occurs  You want to come in to the office for a blood pressure check  You become worse.  Patient Will Follow Care Advice:  YES

## 2013-07-18 ENCOUNTER — Encounter: Payer: Self-pay | Admitting: Family Medicine

## 2013-07-18 ENCOUNTER — Ambulatory Visit (INDEPENDENT_AMBULATORY_CARE_PROVIDER_SITE_OTHER): Payer: BC Managed Care – PPO | Admitting: Family Medicine

## 2013-07-18 VITALS — BP 140/90 | HR 109 | Temp 98.8°F | Ht 62.0 in | Wt 206.0 lb

## 2013-07-18 DIAGNOSIS — I10 Essential (primary) hypertension: Secondary | ICD-10-CM

## 2013-07-18 LAB — BASIC METABOLIC PANEL
BUN: 15 mg/dL (ref 6–23)
CHLORIDE: 101 meq/L (ref 96–112)
CO2: 28 mEq/L (ref 19–32)
Calcium: 9.2 mg/dL (ref 8.4–10.5)
Creatinine, Ser: 0.8 mg/dL (ref 0.4–1.2)
GFR: 80.61 mL/min (ref >60.00)
Glucose, Bld: 122 mg/dL — ABNORMAL HIGH (ref 70–99)
POTASSIUM: 3.5 meq/L (ref 3.5–5.1)
Sodium: 138 mEq/L (ref 135–145)

## 2013-07-18 LAB — HEPATIC FUNCTION PANEL
ALT: 26 U/L (ref 0–35)
AST: 21 U/L (ref 0–37)
Albumin: 4.1 g/dL (ref 3.5–5.2)
Alkaline Phosphatase: 77 U/L (ref 39–117)
BILIRUBIN DIRECT: 0 mg/dL (ref 0.0–0.3)
BILIRUBIN TOTAL: 0.5 mg/dL (ref 0.3–1.2)
Total Protein: 7.1 g/dL (ref 6.0–8.3)

## 2013-07-18 LAB — CBC WITH DIFFERENTIAL/PLATELET
Basophils Absolute: 0.1 10*3/uL (ref 0.0–0.1)
Basophils Relative: 0.6 % (ref 0.0–3.0)
EOS PCT: 2.6 % (ref 0.0–5.0)
Eosinophils Absolute: 0.4 10*3/uL (ref 0.0–0.7)
HCT: 43.3 % (ref 36.0–46.0)
HEMOGLOBIN: 14.6 g/dL (ref 12.0–15.0)
Lymphocytes Relative: 38.5 % (ref 12.0–46.0)
Lymphs Abs: 6.3 10*3/uL — ABNORMAL HIGH (ref 0.7–4.0)
MCHC: 33.8 g/dL (ref 30.0–36.0)
MCV: 88.3 fl (ref 78.0–100.0)
MONO ABS: 1.1 10*3/uL — AB (ref 0.1–1.0)
Monocytes Relative: 6.7 % (ref 3.0–12.0)
NEUTROS ABS: 8.4 10*3/uL — AB (ref 1.4–7.7)
Neutrophils Relative %: 51.6 % (ref 43.0–77.0)
PLATELETS: 470 10*3/uL — AB (ref 150.0–400.0)
RBC: 4.91 Mil/uL (ref 3.87–5.11)
RDW: 13.2 % (ref 11.5–14.6)
WBC: 16.3 10*3/uL — ABNORMAL HIGH (ref 4.5–10.5)

## 2013-07-18 LAB — TSH: TSH: 1.2 u[IU]/mL (ref 0.35–5.50)

## 2013-07-18 MED ORDER — AMLODIPINE BESYLATE 10 MG PO TABS: 10.0000 mg | ORAL_TABLET | Freq: Every day | ORAL | Status: DC

## 2013-07-18 NOTE — Progress Notes (Signed)
Pre visit review using our clinic review tool, if applicable. No additional management support is needed unless otherwise documented below in the visit note. 

## 2013-07-18 NOTE — Progress Notes (Signed)
   Subjective:    Patient ID: Kari Lewis, female    DOB: [birth year], 48 y.o.   MRN: [id number]  HPI Here with concerns about her BP. She is usually well controlled but over the past few weeks she has felt lightheaded or had a mild headache. No SOB or chest pain. She has purchased a cuff to check at home and she is getting systolic readings of 233A to 160s.    Review of Systems  Constitutional: Negative.   Respiratory: Negative.   Cardiovascular: Negative.        Objective:   Physical Exam  Constitutional: She appears well-developed and well-nourished.  Cardiovascular: Normal rate, regular rhythm, normal heart sounds and intact distal pulses.   Pulmonary/Chest: Effort normal and breath sounds normal.          Assessment & Plan:  Increase the Amlodipine to 10 mg daily. Recheck one month

## 2013-07-21 ENCOUNTER — Telehealth: Payer: Self-pay | Admitting: Family Medicine

## 2013-07-21 NOTE — Telephone Encounter (Signed)
Relevant patient education mailed to patient.  

## 2013-09-05 ENCOUNTER — Other Ambulatory Visit: Payer: Self-pay | Admitting: Family Medicine

## 2013-10-14 ENCOUNTER — Encounter: Payer: Self-pay | Admitting: Family Medicine

## 2013-12-10 ENCOUNTER — Encounter: Payer: Self-pay | Admitting: Family Medicine

## 2013-12-10 ENCOUNTER — Ambulatory Visit (INDEPENDENT_AMBULATORY_CARE_PROVIDER_SITE_OTHER): Payer: BC Managed Care – PPO | Admitting: Family Medicine

## 2013-12-10 VITALS — BP 119/83 | HR 91 | Temp 98.6°F | Ht 62.0 in | Wt 206.0 lb

## 2013-12-10 DIAGNOSIS — H6122 Impacted cerumen, left ear: Secondary | ICD-10-CM

## 2013-12-10 DIAGNOSIS — H6120 Impacted cerumen, unspecified ear: Secondary | ICD-10-CM

## 2013-12-10 MED ORDER — NEOMYCIN-POLYMYXIN-HC 1 % OT SOLN: 3.0000 [drp] | Freq: Four times a day (QID) | OTIC | Status: DC

## 2013-12-10 MED ORDER — POTASSIUM CHLORIDE ER 10 MEQ PO TBCR: EXTENDED_RELEASE_TABLET | ORAL | Status: DC

## 2013-12-10 NOTE — Progress Notes (Signed)
Pre visit review using our clinic review tool, if applicable. No additional management support is needed unless otherwise documented below in the visit note. 

## 2013-12-10 NOTE — Progress Notes (Signed)
   Subjective:    Patient ID: Kari Lewis, female    DOB: 1965-09-29, 48 y.o.   MRN: [id number]  HPI Here for 4 days of fullness in the left ear with a vibration sense and some bleeding. No real pain. The bleeding started last night after she attempted to clean the ear with a Q-Tip.    Review of Systems  Constitutional: Negative.   HENT: Positive for ear discharge. Negative for congestion, ear pain and sinus pressure.   Eyes: Negative.   Respiratory: Negative.        Objective:   Physical Exam  Constitutional: She appears well-developed and well-nourished.  HENT:  Right Ear: External ear normal.  Nose: Nose normal.  Mouth/Throat: Oropharynx is clear and moist.  The left canal is partially blocked by cerumen and some dried blood is present.   Eyes: Conjunctivae are normal.  Lymphadenopathy:    She has no cervical adenopathy.          Assessment & Plan:  The cerumen was removed with a speculum. Treat the canal inflammation with Cortisporin Otic drops.

## 2014-01-22 ENCOUNTER — Other Ambulatory Visit: Payer: Self-pay | Admitting: Family Medicine

## 2014-07-23 ENCOUNTER — Other Ambulatory Visit: Payer: Self-pay | Admitting: Family Medicine

## 2014-09-02 ENCOUNTER — Ambulatory Visit (INDEPENDENT_AMBULATORY_CARE_PROVIDER_SITE_OTHER): Payer: BLUE CROSS/BLUE SHIELD | Admitting: Family Medicine

## 2014-09-02 ENCOUNTER — Encounter: Payer: Self-pay | Admitting: Family Medicine

## 2014-09-02 VITALS — BP 122/83 | HR 93 | Temp 98.7°F | Ht 62.0 in | Wt 206.0 lb

## 2014-09-02 DIAGNOSIS — N390 Urinary tract infection, site not specified: Secondary | ICD-10-CM

## 2014-09-02 DIAGNOSIS — K589 Irritable bowel syndrome without diarrhea: Secondary | ICD-10-CM

## 2014-09-02 LAB — POCT URINALYSIS DIPSTICK
Bilirubin, UA: NEGATIVE
Glucose, UA: NEGATIVE
KETONES UA: NEGATIVE
Nitrite, UA: NEGATIVE
SPEC GRAV UA: 1.025
UROBILINOGEN UA: 0.2
pH, UA: 6.5

## 2014-09-02 MED ORDER — CIPROFLOXACIN HCL 500 MG PO TABS: 500.0000 mg | ORAL_TABLET | Freq: Two times a day (BID) | ORAL | Status: DC

## 2014-09-02 MED ORDER — ALIGN 4 MG PO CAPS: ORAL_CAPSULE | ORAL | Status: DC

## 2014-09-02 NOTE — Addendum Note (Signed)
Addended by: Aggie Hacker A on: 09/02/2014 10:45 AM   Modules accepted: Orders

## 2014-09-02 NOTE — Progress Notes (Signed)
Pre visit review using our clinic review tool, if applicable. No additional management support is needed unless otherwise documented below in the visit note. 

## 2014-09-02 NOTE — Progress Notes (Signed)
   Subjective:    Patient ID: Kari Lewis, female    DOB: Jan 23, 1966, 49 y.o.   MRN: [id number]  HPI Here for several things including a 2 month hx of RLQ and right flank pain. No fever. No urinary urgency or frequency. She has frequent diarrhea, especially in the mornings. No weight loss. No bloody or dark stools. She recently had a normal pelvic exam with ehr GYN and he performed a pelvic US showing her ovaries to be normal.    Review of Systems  Constitutional: Negative.   Respiratory: Negative.   Cardiovascular: Negative.   Gastrointestinal: Positive for abdominal pain and diarrhea. Negative for nausea, vomiting, constipation, blood in stool, abdominal distention, anal bleeding and rectal pain.  Genitourinary: Negative.        Objective:   Physical Exam  Constitutional: She appears well-developed and well-nourished. No distress.  Cardiovascular: Normal rate, regular rhythm, normal heart sounds and intact distal pulses.   Pulmonary/Chest: Effort normal and breath sounds normal.  Abdominal: Soft. Bowel sounds are normal. She exhibits no distension and no mass. There is no rebound and no guarding.  mildly tender in the RLQ          Assessment & Plan:  She does have a UTI and this may be the cause of her abdominal pains. Treat with Cipro and culture the sample. It sounds like she has IBS so she will try a probiotic like Align daily. If the pain and diarrhea persist, she may need a colonoscopy to look for things like inflammatory bowel disease.

## 2014-09-04 ENCOUNTER — Other Ambulatory Visit: Payer: Self-pay | Admitting: Family Medicine

## 2014-09-04 LAB — URINE CULTURE: Colony Count: 50000

## 2014-09-07 ENCOUNTER — Telehealth: Payer: Self-pay

## 2014-09-07 ENCOUNTER — Encounter: Payer: Self-pay | Admitting: Family Medicine

## 2014-09-07 ENCOUNTER — Ambulatory Visit (INDEPENDENT_AMBULATORY_CARE_PROVIDER_SITE_OTHER): Payer: BLUE CROSS/BLUE SHIELD | Admitting: Family Medicine

## 2014-09-07 ENCOUNTER — Telehealth: Payer: Self-pay | Admitting: Family Medicine

## 2014-09-07 VITALS — BP 120/78 | HR 128 | Temp 98.3°F | Wt 204.0 lb

## 2014-09-07 DIAGNOSIS — N390 Urinary tract infection, site not specified: Secondary | ICD-10-CM

## 2014-09-07 DIAGNOSIS — R1030 Lower abdominal pain, unspecified: Secondary | ICD-10-CM

## 2014-09-07 LAB — HEPATIC FUNCTION PANEL
ALT: 24 U/L (ref 0–35)
AST: 29 U/L (ref 0–37)
Albumin: 4.4 g/dL (ref 3.5–5.2)
Alkaline Phosphatase: 91 U/L (ref 39–117)
BILIRUBIN DIRECT: 0.1 mg/dL (ref 0.0–0.3)
Total Bilirubin: 0.5 mg/dL (ref 0.2–1.2)
Total Protein: 7.4 g/dL (ref 6.0–8.3)

## 2014-09-07 LAB — POCT URINALYSIS DIPSTICK
Bilirubin, UA: NEGATIVE
GLUCOSE UA: NEGATIVE
Ketones, UA: NEGATIVE
Nitrite, UA: NEGATIVE
Spec Grav, UA: 1.015
Urobilinogen, UA: 0.2
pH, UA: 6

## 2014-09-07 LAB — CBC WITH DIFFERENTIAL/PLATELET
BASOS PCT: 0.3 % (ref 0.0–3.0)
Basophils Absolute: 0.1 10*3/uL (ref 0.0–0.1)
EOS PCT: 2 % (ref 0.0–5.0)
Eosinophils Absolute: 0.4 10*3/uL (ref 0.0–0.7)
HEMATOCRIT: 44.3 % (ref 36.0–46.0)
Hemoglobin: 15.1 g/dL — ABNORMAL HIGH (ref 12.0–15.0)
Lymphocytes Relative: 20.9 % (ref 12.0–46.0)
Lymphs Abs: 4.1 10*3/uL — ABNORMAL HIGH (ref 0.7–4.0)
MCHC: 34 g/dL (ref 30.0–36.0)
MCV: 86.5 fl (ref 78.0–100.0)
Monocytes Absolute: 1.1 10*3/uL — ABNORMAL HIGH (ref 0.1–1.0)
Monocytes Relative: 5.7 % (ref 3.0–12.0)
NEUTROS ABS: 14.1 10*3/uL — AB (ref 1.4–7.7)
Neutrophils Relative %: 71.1 % (ref 43.0–77.0)
PLATELETS: 460 10*3/uL — AB (ref 150.0–400.0)
RBC: 5.12 Mil/uL — AB (ref 3.87–5.11)
RDW: 12.7 % (ref 11.5–15.5)
WBC: 19.8 10*3/uL (ref 4.0–10.5)

## 2014-09-07 LAB — BASIC METABOLIC PANEL
BUN: 25 mg/dL — AB (ref 6–23)
CHLORIDE: 102 meq/L (ref 96–112)
CO2: 24 meq/L (ref 19–32)
Calcium: 9.8 mg/dL (ref 8.4–10.5)
Creatinine, Ser: 1.2 mg/dL (ref 0.40–1.20)
GFR: 50.97 mL/min — ABNORMAL LOW (ref >60.00)
Glucose, Bld: 119 mg/dL — ABNORMAL HIGH (ref 70–99)
Potassium: 3.4 mEq/L — ABNORMAL LOW (ref 3.5–5.1)
SODIUM: 135 meq/L (ref 135–145)

## 2014-09-07 LAB — AMYLASE: Amylase: 23 U/L — ABNORMAL LOW (ref 27–131)

## 2014-09-07 LAB — TSH: TSH: 2.96 u[IU]/mL (ref 0.35–4.50)

## 2014-09-07 LAB — LIPASE: Lipase: 25 U/L (ref 11.0–59.0)

## 2014-09-07 MED ORDER — TRAMADOL HCL 50 MG PO TABS: 100.0000 mg | ORAL_TABLET | ORAL | Status: DC | PRN

## 2014-09-07 NOTE — Progress Notes (Signed)
   Subjective:    Patient ID: Kari Lewis, female    DOB: 1965-08-26, 49 y.o.   MRN: [id number]  HPI Here for diffuse abdominal pain that started about one week ago. She was here last week with this, and we felt that she may have a UTI. She was started on Cipro but she is no better. The urine culture did not grow any bacteria. Now for the past 2 days the pain has been worse and it is concentrated in the lower abdomen. She feels bloated like she is full of gas but she cannot belch or pass flatus. She has not had a BM for the past 3 days (she normally goes twice every day). No urinary sx. She is nauseated but has not vomited. No fever.   Review of Systems  Constitutional: Negative.   Respiratory: Negative.   Cardiovascular: Negative.   Gastrointestinal: Positive for nausea, abdominal pain, constipation and abdominal distention. Negative for vomiting, diarrhea, blood in stool, anal bleeding and rectal pain.  Genitourinary: Positive for difficulty urinating. Negative for dysuria, urgency and frequency.       Objective:   Physical Exam  Constitutional:  In pain   Cardiovascular: Normal rate, regular rhythm, normal heart sounds and intact distal pulses.   Pulmonary/Chest: Effort normal and breath sounds normal.  Abdominal: She exhibits distension. She exhibits no mass. There is tenderness. There is no rebound and no guarding.  Mildly tender in bot upper quadrants, much more tender in the lower abdomen. The abdomen is tense but not hard, I cannot hear any bowel sounds           Assessment & Plan:  The etiology of her abdominal pain is unclear, but she is certainly obstipated. Try MOM one tbsp tid. Drink plenty of water. Get labs and set up an abdominal and pelvic CT scan.

## 2014-09-07 NOTE — Telephone Encounter (Addendum)
CRITICAL VALUE STICKER  CRITICAL VALUE: WBC 19.8  RECEIVER (INITALS): TF  DATE & TIME NOTIFIED: 4:32pm  MD NOTIFIED: [place]: 4:33pm  RESPONSE: STAT CT ordered by Dr. Sarajane Jews

## 2014-09-07 NOTE — Telephone Encounter (Signed)
error 

## 2014-09-07 NOTE — Progress Notes (Signed)
Pre visit review using our clinic review tool, if applicable. No additional management support is needed unless otherwise documented below in the visit note. 

## 2014-09-07 NOTE — Addendum Note (Signed)
Addended by: Alysia Penna A on: 09/07/2014 04:49 PM   Modules accepted: Orders

## 2014-09-08 ENCOUNTER — Encounter (HOSPITAL_COMMUNITY): Payer: Self-pay | Admitting: *Deleted

## 2014-09-08 ENCOUNTER — Telehealth: Payer: Self-pay

## 2014-09-08 ENCOUNTER — Emergency Department (HOSPITAL_COMMUNITY)
Admission: EM | Admit: 2014-09-08 | Discharge: 2014-09-08 | Disposition: A | Discharge status: Home / Self Care | Payer: BLUE CROSS/BLUE SHIELD | Attending: Emergency Medicine | Admitting: Emergency Medicine

## 2014-09-08 ENCOUNTER — Ambulatory Visit (INDEPENDENT_AMBULATORY_CARE_PROVIDER_SITE_OTHER)
Admission: RE | Admit: 2014-09-08 | Discharge: 2014-09-08 | Disposition: A | Discharge status: Home / Self Care | Payer: BLUE CROSS/BLUE SHIELD | Source: Ambulatory Visit | Attending: Family Medicine | Admitting: Family Medicine

## 2014-09-08 DIAGNOSIS — R1030 Lower abdominal pain, unspecified: Secondary | ICD-10-CM | POA: Diagnosis not present

## 2014-09-08 DIAGNOSIS — I10 Essential (primary) hypertension: Secondary | ICD-10-CM | POA: Diagnosis not present

## 2014-09-08 DIAGNOSIS — Z862 Personal history of diseases of the blood and blood-forming organs and certain disorders involving the immune mechanism: Secondary | ICD-10-CM | POA: Insufficient documentation

## 2014-09-08 DIAGNOSIS — F419 Anxiety disorder, unspecified: Secondary | ICD-10-CM | POA: Diagnosis not present

## 2014-09-08 DIAGNOSIS — M549 Dorsalgia, unspecified: Secondary | ICD-10-CM | POA: Insufficient documentation

## 2014-09-08 DIAGNOSIS — R6510 Systemic inflammatory response syndrome (SIRS) of non-infectious origin without acute organ dysfunction: Secondary | ICD-10-CM | POA: Diagnosis not present

## 2014-09-08 DIAGNOSIS — N12 Tubulo-interstitial nephritis, not specified as acute or chronic: Secondary | ICD-10-CM | POA: Diagnosis not present

## 2014-09-08 DIAGNOSIS — R109 Unspecified abdominal pain: Secondary | ICD-10-CM | POA: Diagnosis present

## 2014-09-08 DIAGNOSIS — Z79899 Other long term (current) drug therapy: Secondary | ICD-10-CM | POA: Insufficient documentation

## 2014-09-08 DIAGNOSIS — Z7982 Long term (current) use of aspirin: Secondary | ICD-10-CM | POA: Insufficient documentation

## 2014-09-08 DIAGNOSIS — Z792 Long term (current) use of antibiotics: Secondary | ICD-10-CM | POA: Diagnosis not present

## 2014-09-08 DIAGNOSIS — R000 Tachycardia, unspecified: Secondary | ICD-10-CM | POA: Diagnosis not present

## 2014-09-08 LAB — URINALYSIS, ROUTINE W REFLEX MICROSCOPIC
Bilirubin Urine: NEGATIVE
GLUCOSE, UA: NEGATIVE mg/dL
Ketones, ur: NEGATIVE mg/dL
Leukocytes, UA: NEGATIVE
Nitrite: NEGATIVE
Protein, ur: NEGATIVE mg/dL
Specific Gravity, Urine: 1.021 (ref 1.005–1.030)
UROBILINOGEN UA: 0.2 mg/dL (ref 0.0–1.0)
pH: 6 (ref 5.0–8.0)

## 2014-09-08 LAB — CBC WITH DIFFERENTIAL/PLATELET
Basophils Absolute: 0.1 10*3/uL (ref 0.0–0.1)
Basophils Relative: 1 % (ref 0–1)
EOS PCT: 4 % (ref 0–5)
Eosinophils Absolute: 0.6 10*3/uL (ref 0.0–0.7)
HCT: 40.9 % (ref 36.0–46.0)
Hemoglobin: 14.4 g/dL (ref 12.0–15.0)
LYMPHS PCT: 37 % (ref 12–46)
Lymphs Abs: 5.4 10*3/uL — ABNORMAL HIGH (ref 0.7–4.0)
MCH: 30.1 pg (ref 26.0–34.0)
MCHC: 35.2 g/dL (ref 30.0–36.0)
MCV: 85.4 fL (ref 78.0–100.0)
Monocytes Absolute: 0.8 10*3/uL (ref 0.1–1.0)
Monocytes Relative: 5 % (ref 3–12)
NEUTROS ABS: 7.7 10*3/uL (ref 1.7–7.7)
Neutrophils Relative %: 53 % (ref 43–77)
PLATELETS: 445 10*3/uL — AB (ref 150–400)
RBC: 4.79 MIL/uL (ref 3.87–5.11)
RDW: 12.6 % (ref 11.5–15.5)
WBC MORPHOLOGY: 0
WBC: 14.5 10*3/uL — ABNORMAL HIGH (ref 4.0–10.5)

## 2014-09-08 LAB — URINE MICROSCOPIC-ADD ON

## 2014-09-08 LAB — BASIC METABOLIC PANEL
ANION GAP: 13 (ref 5–15)
BUN: 19 mg/dL (ref 6–20)
CALCIUM: 9.4 mg/dL (ref 8.9–10.3)
CO2: 24 mmol/L (ref 22–32)
Chloride: 99 mmol/L — ABNORMAL LOW (ref 101–111)
Creatinine, Ser: 0.94 mg/dL (ref 0.44–1.00)
GLUCOSE: 97 mg/dL (ref 65–99)
POTASSIUM: 3.1 mmol/L — AB (ref 3.5–5.1)
Sodium: 136 mmol/L (ref 135–145)

## 2014-09-08 MED ORDER — SODIUM CHLORIDE 0.9 % IV BOLUS (SEPSIS)
1000.0000 mL | Freq: Once | INTRAVENOUS | Status: AC
  Administered 2014-09-08: 1000 mL via INTRAVENOUS

## 2014-09-08 MED ORDER — ONDANSETRON 8 MG PO TBDP: 8.0000 mg | ORAL_TABLET | Freq: Three times a day (TID) | ORAL | Status: DC | PRN

## 2014-09-08 MED ORDER — IOHEXOL 300 MG/ML  SOLN
100.0000 mL | Freq: Once | INTRAMUSCULAR | Status: AC | PRN
  Administered 2014-09-08: 100 mL via INTRAVENOUS

## 2014-09-08 MED ORDER — DEXTROSE 5 % IV SOLN
1.0000 g | Freq: Once | INTRAVENOUS | Status: AC
  Administered 2014-09-08: 1 g via INTRAVENOUS
  Filled 2014-09-08: qty 10

## 2014-09-08 MED ORDER — CEPHALEXIN 250 MG PO CAPS: 250.0000 mg | ORAL_CAPSULE | Freq: Four times a day (QID) | ORAL | Status: DC

## 2014-09-08 NOTE — Discharge Instructions (Signed)
We are not a 100% sure if the cause of your symptoms is kidney infection. There is white count elevation - although improved - and your heart rate is fast - but otherwise no specific source of infection discernable on exam and history alone. Please take the antibiotics for the presumed kidney infection.  Please return to the ER if your symptoms worsen; you have increased pain, fevers, chills, inability to keep any medications down, confusion. Otherwise see the outpatient doctor as requested in 10 days.   Abdominal Pain, Women Abdominal (stomach, pelvic, or belly) pain can be caused by many things. It is important to tell your doctor:  The location of the pain.  Does it come and go or is it present all the time?  Are there things that start the pain (eating certain foods, exercise)?  Are there other symptoms associated with the pain (fever, nausea, vomiting, diarrhea)? All of this is helpful to know when trying to find the cause of the pain. CAUSES   Stomach: virus or bacteria infection, or ulcer.  Intestine: appendicitis (inflamed appendix), regional ileitis (Crohn's disease), ulcerative colitis (inflamed colon), irritable bowel syndrome, diverticulitis (inflamed diverticulum of the colon), or cancer of the stomach or intestine.  Gallbladder disease or stones in the gallbladder.  Kidney disease, kidney stones, or infection.  Pancreas infection or cancer.  Fibromyalgia (pain disorder).  Diseases of the female organs:  Uterus: fibroid (non-cancerous) tumors or infection.  Fallopian tubes: infection or tubal pregnancy.  Ovary: cysts or tumors.  Pelvic adhesions (scar tissue).  Endometriosis (uterus lining tissue growing in the pelvis and on the pelvic organs).  Pelvic congestion syndrome (female organs filling up with blood just before the menstrual period).  Pain with the menstrual period.  Pain with ovulation (producing an egg).  Pain with an IUD (intrauterine device,  birth control) in the uterus.  Cancer of the female organs.  Functional pain (pain not caused by a disease, may improve without treatment).  Psychological pain.  Depression. DIAGNOSIS  Your doctor will decide the seriousness of your pain by doing an examination.  Blood tests.  X-rays.  Ultrasound.  CT scan (computed tomography, special type of X-ray).  MRI (magnetic resonance imaging).  Cultures, for infection.  Barium enema (dye inserted in the large intestine, to better view it with X-rays).  Colonoscopy (looking in intestine with a lighted tube).  Laparoscopy (minor surgery, looking in abdomen with a lighted tube).  Major abdominal exploratory surgery (looking in abdomen with a large incision). TREATMENT  The treatment will depend on the cause of the pain.   Many cases can be observed and treated at home.  Over-the-counter medicines recommended by your caregiver.  Prescription medicine.  Antibiotics, for infection.  Birth control pills, for painful periods or for ovulation pain.  Hormone treatment, for endometriosis.  Nerve blocking injections.  Physical therapy.  Antidepressants.  Counseling with a psychologist or psychiatrist.  Minor or major surgery. HOME CARE INSTRUCTIONS   Do not take laxatives, unless directed by your caregiver.  Take over-the-counter pain medicine only if ordered by your caregiver. Do not take aspirin because it can cause an upset stomach or bleeding.  Try a clear liquid diet (broth or water) as ordered by your caregiver. Slowly move to a bland diet, as tolerated, if the pain is related to the stomach or intestine.  Have a thermometer and take your temperature several times a day, and record it.  Bed rest and sleep, if it helps the pain.  Avoid  sexual intercourse, if it causes pain.  Avoid stressful situations.  Keep your follow-up appointments and tests, as your caregiver orders.  If the pain does not go away with  medicine or surgery, you may try:  Acupuncture.  Relaxation exercises (yoga, meditation).  Group therapy.  Counseling. SEEK MEDICAL CARE IF:   You notice certain foods cause stomach pain.  Your home care treatment is not helping your pain.  You need stronger pain medicine.  You want your IUD removed.  You feel faint or lightheaded.  You develop nausea and vomiting.  You develop a rash.  You are having side effects or an allergy to your medicine. SEEK IMMEDIATE MEDICAL CARE IF:   Your pain does not go away or gets worse.  You have a fever.  Your pain is felt only in portions of the abdomen. The right side could possibly be appendicitis. The left lower portion of the abdomen could be colitis or diverticulitis.  You are passing blood in your stools (bright red or black tarry stools, with or without vomiting).  You have blood in your urine.  You develop chills, with or without a fever.  You pass out. MAKE SURE YOU:   Understand these instructions.  Will watch your condition.  Will get help right away if you are not doing well or get worse. Document Released: 01/15/2007 Document Revised: 08/04/2013 Document Reviewed: 02/04/2009 [facility] Patient Information 2015 [place], [place]. This information is not intended to replace advice given to you by your health care provider. Make sure you discuss any questions you have with your health care provider.  Pyelonephritis, Adult Pyelonephritis is a kidney infection. In general, there are 2 main types of pyelonephritis:  Infections that come on quickly without any warning (acute pyelonephritis).  Infections that persist for a long period of time (chronic pyelonephritis). CAUSES  Two main causes of pyelonephritis are:  Bacteria traveling from the bladder to the kidney. This is a problem especially in pregnant women. The urine in the bladder can become filled with bacteria from multiple causes, including:  Inflammation of  the prostate gland (prostatitis).  Sexual intercourse in females.  Bladder infection (cystitis).  Bacteria traveling from the bloodstream to the tissue part of the kidney. Problems that may increase your risk of getting a kidney infection include:  Diabetes.  Kidney stones or bladder stones.  Cancer.  Catheters placed in the bladder.  Other abnormalities of the kidney or ureter. SYMPTOMS   Abdominal pain.  Pain in the side or flank area.  Fever.  Chills.  Upset stomach.  Blood in the urine (dark urine).  Frequent urination.  Strong or persistent urge to urinate.  Burning or stinging when urinating. DIAGNOSIS  Your caregiver may diagnose your kidney infection based on your symptoms. A urine sample may also be taken. TREATMENT  In general, treatment depends on how severe the infection is.   If the infection is mild and caught early, your caregiver may treat you with oral antibiotics and send you home.  If the infection is more severe, the bacteria may have gotten into the bloodstream. This will require intravenous (IV) antibiotics and a hospital stay. Symptoms may include:  High fever.  Severe flank pain.  Shaking chills.  Even after a hospital stay, your caregiver may require you to be on oral antibiotics for a period of time.  Other treatments may be required depending upon the cause of the infection. HOME CARE INSTRUCTIONS   Take your antibiotics as directed. Finish them  even if you start to feel better.  Make an appointment to have your urine checked to make sure the infection is gone.  Drink enough fluids to keep your urine clear or pale yellow.  Take medicines for the bladder if you have urgency and frequency of urination as directed by your caregiver. SEEK IMMEDIATE MEDICAL CARE IF:   You have a fever or persistent symptoms for more than 2-3 days.  You have a fever and your symptoms suddenly get worse.  You are unable to take your antibiotics  or fluids.  You develop shaking chills.  You experience extreme weakness or fainting.  There is no improvement after 2 days of treatment. MAKE SURE YOU:  Understand these instructions.  Will watch your condition.  Will get help right away if you are not doing well or get worse. Document Released: 03/20/2005 Document Revised: 09/19/2011 Document Reviewed: 08/24/2010 [facility] Patient Information 2015 [place], [place]. This information is not intended to replace advice given to you by your health care provider. Make sure you discuss any questions you have with your health care provider.

## 2014-09-08 NOTE — Telephone Encounter (Signed)
Pt aware and wants to ask Dr. Sarajane Jews if she can wait until tomorrow after her daughter's awards ceremony.  Dr. Sarajane Jews does not recommend waiting, as pyelonephritis can cause cause permanent kidney damage. Pt verbalized understanding and said she has to make a few calls first

## 2014-09-08 NOTE — ED Notes (Signed)
Phlebotomy called to draw patient's blood cultures.

## 2014-09-08 NOTE — Telephone Encounter (Signed)
Left message for pt to call back.   Per Dr. Sarajane Jews, advise pt to go to ER for admission, as CT scan shows pyelonephritis and needs IV abx

## 2014-09-08 NOTE — ED Notes (Addendum)
Pt in after abdominal CT, told she would need to be admitted for a bacterial infection, no distress noted, pt had scan completed due to abd pain x1 week, denies n/v- pt told she had pyelonephritis, has been taking antibiotics for a UTI, feels better today

## 2014-09-08 NOTE — Telephone Encounter (Signed)
Noted, we will wait for more results

## 2014-09-08 NOTE — ED Provider Notes (Signed)
CSN: [id number]     Arrival date & time 09/08/14  1649 History   First MD Initiated Contact with Patient 09/08/14 1928     Chief Complaint  Patient presents with  . Abdominal Pain     (Consider location/radiation/quality/duration/timing/severity/associated sxs/prior Treatment) HPI Comments: Pt comes in to the ER with cc of abnormal CT scan. Pt has been having some abdominal discomfort the last few days and started on cipro. She had persistent pain, so CT done and it shows bilateral pyelonephritis - and so she was advised to come to the Er. Pt states that she feel better now than she did when she saw her doctor. She denies any uti like sx. Pt has some back pain. She has no hx of pelvic disorders, vaginal discharge or bleeding. Pt has no fevers. There is no hx of autoimmune conditions.   ROS 10 Systems reviewed and are negative for acute change except as noted in the HPI.     The history is provided by the patient.    Past Medical History  Diagnosis Date  . Allergy   . Hypertension   . Anxiety   . Leukocytosis     chronic & benign  . PPD positive, treated     with INH for 6 months   Past Surgical History  Procedure Laterality Date  . Biopsy breast      benign 1996   Family History  Problem Relation Age of Onset  . Breast cancer    . Coronary artery disease    . Diabetes    . Hyperlipidemia    . Hypertension    . Lung cancer    . Prostate cancer    . Stroke     History  Substance Use Topics  . Smoking status: Never Smoker   . Smokeless tobacco: Never Used  . Alcohol Use: No   OB History    No data available     Review of Systems  All other systems reviewed and are negative.     Allergies  Lisinopril  Home Medications   Prior to Admission medications   Medication Sig Start Date End Date Taking? Authorizing Provider  amLODipine (NORVASC) 10 MG tablet TAKE 1 TABLET BY MOUTH EVERY DAY 07/24/14  Yes Laurey Morale, MD  aspirin 81 MG tablet Take 81 mg by  mouth daily.     Yes Historical Provider, MD  Calcium Carbonate-Vit D-Min (CALCIUM 1200 PO) Take 1,200 mg by mouth daily.   Yes Historical Provider, MD  Cholecalciferol (VITAMIN D) 2000 UNITS tablet Take 2,000 Units by mouth daily.   Yes Historical Provider, MD  ciprofloxacin (CIPRO) 500 MG tablet Take 1 tablet (500 mg total) by mouth 2 (two) times daily. 09/02/14  Yes Laurey Morale, MD  hydrochlorothiazide (HYDRODIURIL) 25 MG tablet TAKE 1 TABLET BY MOUTH DAILY 09/04/14  Yes Laurey Morale, MD  levonorgestrel (MIRENA) 20 MCG/24HR IUD 1 each by Intrauterine route once.   Yes Historical Provider, MD  Melatonin 5 MG SUBL Place 5 mg under the tongue.   Yes Historical Provider, MD  Multiple Vitamin (MULTIVITAMIN) tablet Take 1 tablet by mouth daily.     Yes Historical Provider, MD  NEOMYCIN-POLYMYXIN-HYDROCORTISONE (CORTISPORIN) 1 % SOLN otic solution Place 3 drops into the left ear 4 (four) times daily. 12/10/13  Yes Laurey Morale, MD  phentermine 37.5 MG capsule Take 37.5 mg by mouth every morning.     Yes Historical Provider, MD  potassium chloride (KLOR-CON 10) 10  MEQ tablet TAKE 1 TABLET BY MOUTH EVERY DAY 12/10/13  Yes Laurey Morale, MD  traMADol (ULTRAM) 50 MG tablet Take 2 tablets (100 mg total) by mouth every 4 (four) hours as needed for moderate pain. 09/07/14  Yes Laurey Morale, MD  venlafaxine XR (EFFEXOR-XR) 150 MG 24 hr capsule TAKE ONE CAPSULE BY MOUTH EVERY DAY 01/22/14  Yes Laurey Morale, MD  cephALEXin (KEFLEX) 250 MG capsule Take 1 capsule (250 mg total) by mouth 4 (four) times daily. 09/08/14   Varney Biles, MD  DULoxetine (CYMBALTA) 60 MG capsule Take 1 capsule (60 mg total) by mouth daily. 12/19/10 12/19/11  Laurey Morale, MD  ondansetron (ZOFRAN ODT) 8 MG disintegrating tablet Take 1 tablet (8 mg total) by mouth every 8 (eight) hours as needed for nausea. 09/08/14   Kobey Sides, MD   BP 131/80 mmHg  Pulse 98  Temp(Src) 98 F (36.7 C) (Oral)  Resp 22  Ht 5\' 2"  (1.575 m)  Wt 207 lb 12.8  oz (94.257 kg)  BMI 38.00 kg/m2  SpO2 99% Physical Exam  Constitutional: She is oriented to person, place, and time. She appears well-developed and well-nourished.  HENT:  Head: Normocephalic and atraumatic.  Eyes: EOM are normal. Pupils are equal, round, and reactive to light.  Neck: Neck supple.  Cardiovascular: Normal rate, regular rhythm and normal heart sounds.   No murmur heard. Pulmonary/Chest: Effort normal. No respiratory distress.  Abdominal: Soft. She exhibits no distension. There is no tenderness. There is no rebound and no guarding.  Neurological: She is alert and oriented to person, place, and time.  Skin: Skin is warm and dry.  Nursing note and vitals reviewed.   ED Course  Procedures (including critical care time) Labs Review Labs Reviewed  CBC WITH DIFFERENTIAL/PLATELET - Abnormal; Notable for the following:    WBC 14.5 (*)    Platelets 445 (*)    Lymphs Abs 5.4 (*)    All other components within normal limits  BASIC METABOLIC PANEL - Abnormal; Notable for the following:    Potassium 3.1 (*)    Chloride 99 (*)    All other components within normal limits  URINALYSIS, ROUTINE W REFLEX MICROSCOPIC (NOT AT [facility]) - Abnormal; Notable for the following:    Hgb urine dipstick TRACE (*)    All other components within normal limits  URINE CULTURE  CULTURE, BLOOD (ROUTINE X 2)  CULTURE, BLOOD (ROUTINE X 2)  URINE MICROSCOPIC-ADD ON    Imaging Review Ct Abdomen Pelvis W Contrast  09/08/2014   CLINICAL DATA:  Bilateral flank pain  EXAM: CT ABDOMEN AND PELVIS WITH CONTRAST  TECHNIQUE: Multidetector CT imaging of the abdomen and pelvis was performed using the standard protocol following bolus administration of intravenous contrast.  CONTRAST:  165mL OMNIPAQUE IOHEXOL 300 MG/ML  SOLN  COMPARISON:  None.  FINDINGS: Lung bases are free of acute infiltrate or sizable effusion. The liver, gallbladder, spleen, adrenal glands and pancreas are all normal in their CT appearance.   Kidneys demonstrate bilateral enhancement although delayed images demonstrate some patchy areas of decreased enhancement bilaterally. It would be difficult to exclude a component of acute pyelonephritis based on this appearance although it may simply be related to the timing of the imaging. This would correlate with patient's known elevated white blood cell count. No renal calculi or obstructive changes are seen.  The appendix is within normal limits. Minimal diverticular change is noted without diverticulitis. An IUD is noted in place. The bladder  is partially distended. No pelvic mass lesion or sidewall abnormality is noted. No acute bony abnormality is seen.  IMPRESSION: Patchy changes in the kidneys which may represent acute pyelonephritis. This would correlate with the patient's elevated white blood cell count.  No other focal abnormality is noted.  These results will be called to the ordering clinician or representative by the Radiologist Assistant, and communication documented in the PACS or zVision Dashboard.   Electronically Signed   By: Inez Catalina M.D.   On: 09/08/2014 14:35     EKG Interpretation None      6/9 - spoke with patient today. She is doing well. Advised her to still see her pcp for repeat lab work and evaluation.  MDM   Final diagnoses:  SIRS (systemic inflammatory response syndrome)  Abdominal pain, unspecified abdominal location  Pyelonephritis    Pt comes in with elevated WC and tachycardia. She was sent to the ER for abnormal CT - concerning for pyelonephritis. Pt however has no uti like symptoms. She has some lower quadrant abd pain. No hx of pelvic organ pathology, and she has no vaginal discharge. Pt is not at risk for pelvic infections.  WC is elevated - but slightly better than when she saw PCP.  Pt is non toxic and feels a lot better compared to when she saw her pcp.  With her SIRs, i will get blood cultures. We will give her keflex rx. Strict return  precautions discussed.     Varney Biles, MD 09/10/14 [phone]

## 2014-09-08 NOTE — ED Notes (Signed)
MD at the bedside  

## 2014-09-10 LAB — URINE CULTURE
COLONY COUNT: NO GROWTH
Culture: NO GROWTH

## 2014-09-15 LAB — CULTURE, BLOOD (ROUTINE X 2)
CULTURE: NO GROWTH
Culture: NO GROWTH

## 2014-09-16 ENCOUNTER — Ambulatory Visit (INDEPENDENT_AMBULATORY_CARE_PROVIDER_SITE_OTHER): Payer: BLUE CROSS/BLUE SHIELD | Admitting: Family Medicine

## 2014-09-16 ENCOUNTER — Encounter: Payer: Self-pay | Admitting: Family Medicine

## 2014-09-16 VITALS — BP 127/87 | HR 88 | Temp 98.2°F | Ht 62.0 in | Wt 208.0 lb

## 2014-09-16 DIAGNOSIS — N10 Acute tubulo-interstitial nephritis: Secondary | ICD-10-CM | POA: Diagnosis not present

## 2014-09-16 LAB — POCT URINALYSIS DIPSTICK
BILIRUBIN UA: NEGATIVE
GLUCOSE UA: NEGATIVE
Ketones, UA: NEGATIVE
Nitrite, UA: NEGATIVE
Protein, UA: NEGATIVE
Spec Grav, UA: 1.02
UROBILINOGEN UA: 0.2
pH, UA: 6.5

## 2014-09-16 NOTE — Progress Notes (Signed)
   Subjective:    Patient ID: Kari Lewis, female    DOB: [birth year], 49 y.o.   MRN: [id number]  HPI Here to follow up an ER visit on 09-08-14 for pyelonephritis. She had been having abdominal pains and back pains for several days when she saw [place] in the office that day. She was very tender on abdominal exam, her WBC was 19.8, and she had bilateral pyelonephritis on a CT scan. Se had been taking Cipro for several days prior to then. She was sent to the ER where she actually felt better. She was given IV antibiotics and fluids, and she was sent home on Keflex. Today is the last day of the Keflex. She feels totally back to normal now with no pain or discomfort. Her urine and blood cultures from the ER visit remain negative.    Review of Systems  Constitutional: Negative.   Respiratory: Negative.   Cardiovascular: Negative.   Gastrointestinal: Negative.   Genitourinary: Negative.        Objective:   Physical Exam  Constitutional: She appears well-developed and well-nourished.  Neck: Neck supple. No thyromegaly present.  Cardiovascular: Normal rate, regular rhythm, normal heart sounds and intact distal pulses.   Pulmonary/Chest: Effort normal and breath sounds normal.  Abdominal: Soft. Bowel sounds are normal. She exhibits no distension and no mass. There is no tenderness. There is no rebound and no guarding.  Lymphadenopathy:    She has no cervical adenopathy.          Assessment & Plan:  Pyelonepritis, resolved. Er sx are gone and her UA today is clear. She will take the last dose of Keflex and return prn

## 2014-09-16 NOTE — Progress Notes (Signed)
Pre visit review using our clinic review tool, if applicable. No additional management support is needed unless otherwise documented below in the visit note. 

## 2014-10-08 LAB — HM MAMMOGRAPHY

## 2014-10-12 ENCOUNTER — Encounter: Payer: Self-pay | Admitting: Family Medicine

## 2014-10-12 LAB — HM MAMMOGRAPHY

## 2014-10-23 ENCOUNTER — Encounter: Payer: Self-pay | Admitting: Family Medicine

## 2014-10-23 ENCOUNTER — Other Ambulatory Visit: Payer: Self-pay | Admitting: Family Medicine

## 2014-12-03 ENCOUNTER — Other Ambulatory Visit: Payer: Self-pay | Admitting: Family Medicine

## 2014-12-03 NOTE — Telephone Encounter (Signed)
Can we refill these? 

## 2015-01-08 ENCOUNTER — Ambulatory Visit: Payer: BLUE CROSS/BLUE SHIELD | Admitting: Family Medicine

## 2015-01-08 DIAGNOSIS — Z0289 Encounter for other administrative examinations: Secondary | ICD-10-CM

## 2015-01-22 ENCOUNTER — Other Ambulatory Visit: Payer: Self-pay | Admitting: Family Medicine

## 2015-02-05 ENCOUNTER — Ambulatory Visit (INDEPENDENT_AMBULATORY_CARE_PROVIDER_SITE_OTHER): Payer: BLUE CROSS/BLUE SHIELD | Admitting: Family Medicine

## 2015-02-05 ENCOUNTER — Encounter: Payer: Self-pay | Admitting: Family Medicine

## 2015-02-05 VITALS — BP 113/80 | HR 96 | Temp 98.5°F | Ht 62.0 in | Wt 200.0 lb

## 2015-02-05 DIAGNOSIS — IMO0001 Reserved for inherently not codable concepts without codable children: Secondary | ICD-10-CM

## 2015-02-05 DIAGNOSIS — M791 Myalgia: Secondary | ICD-10-CM

## 2015-02-05 DIAGNOSIS — M609 Myositis, unspecified: Secondary | ICD-10-CM | POA: Diagnosis not present

## 2015-02-05 LAB — CBC WITH DIFFERENTIAL/PLATELET
BASOS PCT: 0.8 % (ref 0.0–3.0)
Basophils Absolute: 0.1 10*3/uL (ref 0.0–0.1)
Eosinophils Absolute: 0.6 10*3/uL (ref 0.0–0.7)
Eosinophils Relative: 5.4 % — ABNORMAL HIGH (ref 0.0–5.0)
HEMATOCRIT: 44 % (ref 36.0–46.0)
HEMOGLOBIN: 14.8 g/dL (ref 12.0–15.0)
LYMPHS PCT: 37 % (ref 12.0–46.0)
Lymphs Abs: 3.8 10*3/uL (ref 0.7–4.0)
MCHC: 33.7 g/dL (ref 30.0–36.0)
MCV: 86.9 fl (ref 78.0–100.0)
MONO ABS: 0.6 10*3/uL (ref 0.1–1.0)
Monocytes Relative: 6.1 % (ref 3.0–12.0)
Neutro Abs: 5.2 10*3/uL (ref 1.4–7.7)
Neutrophils Relative %: 50.7 % (ref 43.0–77.0)
Platelets: 462 10*3/uL — ABNORMAL HIGH (ref 150.0–400.0)
RBC: 5.06 Mil/uL (ref 3.87–5.11)
RDW: 13 % (ref 11.5–15.5)
WBC: 10.3 10*3/uL (ref 4.0–10.5)

## 2015-02-05 LAB — HEPATIC FUNCTION PANEL
ALT: 17 U/L (ref 0–35)
AST: 17 U/L (ref 0–37)
Albumin: 4.2 g/dL (ref 3.5–5.2)
Alkaline Phosphatase: 92 U/L (ref 39–117)
BILIRUBIN DIRECT: 0.1 mg/dL (ref 0.0–0.3)
Total Bilirubin: 0.3 mg/dL (ref 0.2–1.2)
Total Protein: 6.9 g/dL (ref 6.0–8.3)

## 2015-02-05 LAB — CK: Total CK: 73 U/L (ref 7–177)

## 2015-02-05 LAB — BASIC METABOLIC PANEL
BUN: 16 mg/dL (ref 6–23)
CALCIUM: 9.4 mg/dL (ref 8.4–10.5)
CHLORIDE: 103 meq/L (ref 96–112)
CO2: 27 mEq/L (ref 19–32)
CREATININE: 0.69 mg/dL (ref 0.40–1.20)
GFR: 96.35 mL/min (ref >60.00)
Glucose, Bld: 107 mg/dL — ABNORMAL HIGH (ref 70–99)
Potassium: 3.8 mEq/L (ref 3.5–5.1)
Sodium: 139 mEq/L (ref 135–145)

## 2015-02-05 LAB — SEDIMENTATION RATE: Sed Rate: 17 mm/hr (ref 0–22)

## 2015-02-05 LAB — RHEUMATOID FACTOR

## 2015-02-05 LAB — C-REACTIVE PROTEIN: CRP: 0.6 mg/dL (ref 0.5–20.0)

## 2015-02-05 LAB — TSH: TSH: 2.05 u[IU]/mL (ref 0.35–4.50)

## 2015-02-05 NOTE — Progress Notes (Signed)
Pre visit review using our clinic review tool, if applicable. No additional management support is needed unless otherwise documented below in the visit note. 

## 2015-02-05 NOTE — Progress Notes (Signed)
   Subjective:    Patient ID: Kari Lewis, female    DOB: July 03, 1965, 49 y.o.   MRN: [id number]  HPI Here for one week of diffuse muscle pains and extreme fatigue. No fever or cough or ST. She has had some nausea but has not vomited. No abdominal cramps. She pulled a tick off her left hip 4 days ago but has seen no rashes at all.    Review of Systems  Constitutional: Positive for fatigue. Negative for fever, chills and diaphoresis.  HENT: Negative.   Eyes: Negative.   Respiratory: Negative.   Cardiovascular: Negative.   Gastrointestinal: Positive for nausea. Negative for vomiting, abdominal pain, diarrhea, constipation, blood in stool, abdominal distention and anal bleeding.  Endocrine: Negative.   Genitourinary: Negative.   Musculoskeletal: Positive for myalgias. Negative for joint swelling, arthralgias, gait problem, neck pain and neck stiffness.  Skin: Negative.   Neurological: Negative.        Objective:   Physical Exam  Constitutional: She is oriented to person, place, and time. She appears well-developed and well-nourished. No distress.  HENT:  Right Ear: External ear normal.  Left Ear: External ear normal.  Nose: Nose normal.  Mouth/Throat: Oropharynx is clear and moist.  Eyes: Conjunctivae are normal. No scleral icterus.  Neck: Neck supple. No thyromegaly present.  Cardiovascular: Normal rate, regular rhythm, normal heart sounds and intact distal pulses.   Pulmonary/Chest: Effort normal and breath sounds normal.  Abdominal: Soft. Bowel sounds are normal. She exhibits no distension and no mass. There is no tenderness. There is no rebound and no guarding.  No HSM   Musculoskeletal: Normal range of motion. She exhibits no edema or tenderness.  Lymphadenopathy:    She has no cervical adenopathy.  Neurological: She is alert and oriented to person, place, and time.  Skin: No rash noted.          Assessment & Plan:  Probable viral illness which should resolve  on its own. Drink fluids and take Motrin prn. We will get labs to rule out other etiologies however.

## 2015-02-08 LAB — EPSTEIN-BARR VIRUS VCA, IGM

## 2015-02-08 LAB — LYME AB/WESTERN BLOT REFLEX: B burgdorferi Ab IgG+IgM: 0.21 {ISR}

## 2015-02-08 LAB — ANA: ANA: NEGATIVE

## 2015-02-08 LAB — EPSTEIN-BARR VIRUS VCA, IGG: EBV VCA IgG: 525 U/mL — ABNORMAL HIGH (ref <18.0)

## 2015-02-11 ENCOUNTER — Telehealth: Payer: Self-pay | Admitting: Family Medicine

## 2015-02-11 NOTE — Telephone Encounter (Signed)
See my result note.

## 2015-02-11 NOTE — Telephone Encounter (Signed)
Pt would like results of labs done 02/05/15. Please call back.

## 2015-02-12 NOTE — Telephone Encounter (Signed)
Pt returning your call,  has cel phone on her now and will not miss your call. [phone]

## 2015-02-12 NOTE — Telephone Encounter (Signed)
This is a duplicate, see result note.  

## 2015-02-12 NOTE — Telephone Encounter (Signed)
I spoke with pt and went over recent lab results.  

## 2015-04-23 ENCOUNTER — Other Ambulatory Visit: Payer: Self-pay | Admitting: Family Medicine

## 2015-06-08 ENCOUNTER — Encounter: Payer: Self-pay | Admitting: Family Medicine

## 2015-06-08 ENCOUNTER — Ambulatory Visit (INDEPENDENT_AMBULATORY_CARE_PROVIDER_SITE_OTHER): Payer: BLUE CROSS/BLUE SHIELD | Admitting: Family Medicine

## 2015-06-08 VITALS — BP 123/78 | HR 92 | Temp 98.4°F | Ht 62.0 in | Wt 210.0 lb

## 2015-06-08 DIAGNOSIS — R040 Epistaxis: Secondary | ICD-10-CM

## 2015-06-08 MED ORDER — VARENICLINE TARTRATE 1 MG PO TABS: 1.0000 mg | ORAL_TABLET | Freq: Two times a day (BID) | ORAL | Status: DC

## 2015-06-08 MED ORDER — VARENICLINE TARTRATE 0.5 MG X 11 & 1 MG X 42 PO MISC: ORAL | Status: DC

## 2015-06-08 NOTE — Progress Notes (Signed)
   Subjective:    Patient ID: Kari Lewis, female    DOB: 02/26/1966, 50 y.o.   MRN: [id number]  HPI Here for 6 episodes of nosebleeds from the left nostril in the past week. No pain or sinus congestion. No headache. She does take aspirin daily. No unusual bruising lately. She runs a humidifier in the bedroom every night.    Review of Systems  Constitutional: Negative.  Negative for fever, diaphoresis, activity change, appetite change, fatigue and unexpected weight change.  HENT: Positive for nosebleeds. Negative for congestion, dental problem, ear pain, hearing loss, postnasal drip, sinus pressure, sore throat, tinnitus, trouble swallowing and voice change.   Eyes: Negative.  Negative for photophobia, pain, discharge, redness and visual disturbance.  Respiratory: Negative.  Negative for apnea, cough, choking, chest tightness, shortness of breath, wheezing and stridor.   Cardiovascular: Negative.  Negative for chest pain, palpitations and leg swelling.  Gastrointestinal: Negative.  Negative for nausea, vomiting, abdominal pain, diarrhea, constipation, blood in stool, abdominal distention and rectal pain.  Genitourinary: Negative.  Negative for dysuria, urgency, frequency, hematuria, flank pain, vaginal bleeding, vaginal discharge, enuresis, difficulty urinating, vaginal pain and menstrual problem.  Musculoskeletal: Negative.  Negative for myalgias, back pain, joint swelling, arthralgias, gait problem, neck pain and neck stiffness.  Skin: Negative.  Negative for color change, pallor, rash and wound.  Neurological: Negative.  Negative for dizziness, tremors, seizures, syncope, speech difficulty, weakness, light-headedness, numbness and headaches.  Hematological: Negative.  Negative for adenopathy. Does not bruise/bleed easily.  Psychiatric/Behavioral: Negative.  Negative for hallucinations, behavioral problems, confusion, sleep disturbance, dysphoric mood and agitation. The patient is not  nervous/anxious.        Objective:   Physical Exam  Constitutional: She is oriented to person, place, and time. She appears well-developed and well-nourished.  HENT:  Right Ear: External ear normal.  Left Ear: External ear normal.  Nose: Nose normal.  Mouth/Throat: Oropharynx is clear and moist.  Eyes: Conjunctivae are normal.  Neck: Neck supple. No thyromegaly present.  Pulmonary/Chest: Effort normal and breath sounds normal.  Lymphadenopathy:    She has no cervical adenopathy.  Neurological: She is alert and oriented to person, place, and time.  Skin: No erythema.          Assessment & Plan:  Nosebleeds. Her use of aspirin may contribute to this so she will stop. Use saline nasal sprays. Recheck prn

## 2015-06-08 NOTE — Progress Notes (Signed)
Pre visit review using our clinic review tool, if applicable. No additional management support is needed unless otherwise documented below in the visit note. 

## 2015-07-20 ENCOUNTER — Other Ambulatory Visit: Payer: Self-pay | Admitting: Family Medicine

## 2015-09-03 ENCOUNTER — Other Ambulatory Visit: Payer: Self-pay | Admitting: Family Medicine

## 2015-09-03 NOTE — Telephone Encounter (Signed)
Okay to fill Chantix?

## 2015-10-13 ENCOUNTER — Encounter: Payer: Self-pay | Admitting: Adult Health

## 2015-10-13 ENCOUNTER — Ambulatory Visit (INDEPENDENT_AMBULATORY_CARE_PROVIDER_SITE_OTHER): Payer: BLUE CROSS/BLUE SHIELD | Admitting: Adult Health

## 2015-10-13 VITALS — BP 122/84 | Temp 98.3°F | Ht 62.0 in | Wt 212.0 lb

## 2015-10-13 DIAGNOSIS — M79674 Pain in right toe(s): Secondary | ICD-10-CM

## 2015-10-13 DIAGNOSIS — Z803 Family history of malignant neoplasm of breast: Secondary | ICD-10-CM | POA: Diagnosis not present

## 2015-10-13 DIAGNOSIS — Z1231 Encounter for screening mammogram for malignant neoplasm of breast: Secondary | ICD-10-CM | POA: Diagnosis not present

## 2015-10-13 LAB — HM MAMMOGRAPHY

## 2015-10-13 NOTE — Progress Notes (Signed)
Subjective:    Patient ID: Kari Lewis, female    DOB: 11-Dec-1965, 50 y.o.   MRN: [id number]  HPI  50 year old female who presents to the office today for an acute complaint. She reports that three days ago she stubbed her right pinky toe on a door frame at home. Since that time she reports pain from under the toenail and bruising around the distal tip of the toe nail.   She has been buddy taping, which she reports helps with the pain.   She is concerned for infection.   Review of Systems  Constitutional: Negative.   Skin: Positive for color change and wound.  All other systems reviewed and are negative.  Past Medical History  Diagnosis Date  . Allergy   . Hypertension   . Anxiety   . Leukocytosis     chronic & benign  . PPD positive, treated     with INH for 6 months    Social History   Social History  . Marital Status: Married    Spouse Name: N/A  . Number of Children: N/A  . Years of Education: N/A   Occupational History  . Not on file.   Social History Main Topics  . Smoking status: Never Smoker   . Smokeless tobacco: Never Used  . Alcohol Use: No  . Drug Use: No  . Sexual Activity: Not on file   Other Topics Concern  . Not on file   Social History Narrative    Past Surgical History  Procedure Laterality Date  . Biopsy breast      benign 1996    Family History  Problem Relation Age of Onset  . Breast cancer    . Coronary artery disease    . Diabetes    . Hyperlipidemia    . Hypertension    . Lung cancer    . Prostate cancer    . Stroke      Allergies  Allergen Reactions  . Lisinopril     REACTION: cough    Current Outpatient Prescriptions on File Prior to Visit  Medication Sig Dispense Refill  . amLODipine (NORVASC) 10 MG tablet TAKE 1 TABLET BY MOUTH DAILY 90 tablet 0  . CHANTIX STARTING MONTH PAK 0.5 MG X 11 & 1 MG X 42 tablet TAKE AS DIRECTED 53 tablet 0  . hydrochlorothiazide (HYDRODIURIL) 25 MG tablet TAKE 1 TABLET BY  MOUTH DAILY 90 tablet 3  . levonorgestrel (MIRENA) 20 MCG/24HR IUD 1 each by Intrauterine route once.    . Melatonin 5 MG SUBL Place 5 mg under the tongue.    . Multiple Vitamin (MULTIVITAMIN) tablet Take 1 tablet by mouth daily.      . potassium chloride (K-DUR) 10 MEQ tablet TAKE 1 TABLET BY MOUTH EVERY DAY 90 tablet 3  . varenicline (CHANTIX CONTINUING MONTH PAK) 1 MG tablet Take 1 tablet (1 mg total) by mouth 2 (two) times daily. 60 tablet 5  . venlafaxine XR (EFFEXOR-XR) 150 MG 24 hr capsule TAKE ONE CAPSULE BY MOUTH DAILY 90 capsule 1  . venlafaxine XR (EFFEXOR-XR) 150 MG 24 hr capsule TAKE ONE CAPSULE BY MOUTH DAILY 90 capsule 0  . DULoxetine (CYMBALTA) 60 MG capsule Take 1 capsule (60 mg total) by mouth daily. 30 capsule 11   No current facility-administered medications on file prior to visit.    BP 122/84 mmHg  Temp(Src) 98.3 F (36.8 C) (Oral)  Ht 5\' 2"  (1.575 m)  Wt 212  lb (96.163 kg)  BMI 38.77 kg/m2       Objective:   Physical Exam  Constitutional: She is oriented to person, place, and time. She appears well-developed and well-nourished. No distress.  Cardiovascular: Normal rate, regular rhythm, normal heart sounds and intact distal pulses.  Exam reveals no gallop and no friction rub.   No murmur heard. Neurological: She is alert and oriented to person, place, and time.  Skin: Skin is warm and dry. She is not diaphoretic.  No signs of infection. She does have a vesicle filled with blood under her toe nail of the pinky toe.   Psychiatric: She has a normal mood and affect. Her behavior is normal. Thought content normal.  Nursing note and vitals reviewed.     Assessment & Plan:  1. Toe pain, right Procedure:  Incision and drainage of abscess Risks, benefits, and alternatives explained and consent obtained. Time out conducted. Surface cleaned with alcohol. Cold spray applied to distal tip of pinky right pinky toe Adequate anesthesia ensured. # 20 g needle used to  make a stab incision into the vesicle Bloody discharge expressed with pressure. Hemostasis achieved. Pt stable. Aftercare and follow-up advised.  Dorothyann Peng, NP

## 2015-10-15 ENCOUNTER — Encounter: Payer: Self-pay | Admitting: Family Medicine

## 2015-10-19 ENCOUNTER — Other Ambulatory Visit: Payer: Self-pay | Admitting: Family Medicine

## 2015-11-27 ENCOUNTER — Other Ambulatory Visit: Payer: Self-pay | Admitting: Family Medicine

## 2015-11-29 ENCOUNTER — Encounter: Payer: Self-pay | Admitting: Family Medicine

## 2015-11-29 ENCOUNTER — Ambulatory Visit (INDEPENDENT_AMBULATORY_CARE_PROVIDER_SITE_OTHER): Payer: BLUE CROSS/BLUE SHIELD | Admitting: Family Medicine

## 2015-11-29 VITALS — BP 134/81 | HR 95 | Temp 98.0°F | Ht 62.0 in | Wt 217.0 lb

## 2015-11-29 DIAGNOSIS — R040 Epistaxis: Secondary | ICD-10-CM | POA: Diagnosis not present

## 2015-11-29 NOTE — Progress Notes (Signed)
   Subjective:    Patient ID: Kari Lewis, female    DOB: Nov 19, 1965, 50 y.o.   MRN: [id number]  HPI Here for several days of recurrent nosebleeds from the left nostril. She had a few episodes of this last March. There is no pain, no URI or sinus symptoms. No other bleeding or bruising issues.    Review of Systems  Constitutional: Negative.   HENT: Positive for nosebleeds. Negative for congestion, ear pain, facial swelling, postnasal drip, sinus pressure and sore throat.   Eyes: Negative.   Respiratory: Negative.   Hematological: Does not bruise/bleed easily.       Objective:   Physical Exam  Constitutional: She appears well-developed and well-nourished.  HENT:  Right Ear: External ear normal.  Left Ear: External ear normal.  Mouth/Throat: Oropharynx is clear and moist.  Her nasal septum is deviated to the right. No active bleeding seen, no clots.   Eyes: Conjunctivae are normal.  Neck: No thyromegaly present.  Pulmonary/Chest: Effort normal and breath sounds normal. No respiratory distress. She has no wheezes. She has no rales. She exhibits no tenderness.  Lymphadenopathy:    She has no cervical adenopathy.          Assessment & Plan:  Epistaxis, which may be exacerbated by a deviated septum. Keep the nasal passages moist with saline sprays. Refer to ENT.  Laurey Morale, MD

## 2015-11-29 NOTE — Telephone Encounter (Signed)
Rx refill sent to pharmacy. 

## 2015-11-29 NOTE — Progress Notes (Signed)
Pre visit review using our clinic review tool, if applicable. No additional management support is needed unless otherwise documented below in the visit note. 

## 2015-12-21 DIAGNOSIS — R0683 Snoring: Secondary | ICD-10-CM | POA: Diagnosis not present

## 2015-12-21 DIAGNOSIS — R040 Epistaxis: Secondary | ICD-10-CM | POA: Diagnosis not present

## 2016-01-04 ENCOUNTER — Other Ambulatory Visit (HOSPITAL_BASED_OUTPATIENT_CLINIC_OR_DEPARTMENT_OTHER): Payer: Self-pay

## 2016-01-04 DIAGNOSIS — G4730 Sleep apnea, unspecified: Secondary | ICD-10-CM

## 2016-01-04 DIAGNOSIS — R0683 Snoring: Secondary | ICD-10-CM

## 2016-01-04 NOTE — Progress Notes (Signed)
Home

## 2016-01-11 ENCOUNTER — Ambulatory Visit (HOSPITAL_BASED_OUTPATIENT_CLINIC_OR_DEPARTMENT_OTHER): Payer: BLUE CROSS/BLUE SHIELD | Attending: Otolaryngology | Admitting: Internal Medicine

## 2016-01-11 VITALS — Ht 64.0 in | Wt 205.0 lb

## 2016-01-11 DIAGNOSIS — G4733 Obstructive sleep apnea (adult) (pediatric): Secondary | ICD-10-CM | POA: Insufficient documentation

## 2016-01-11 DIAGNOSIS — G4736 Sleep related hypoventilation in conditions classified elsewhere: Secondary | ICD-10-CM | POA: Insufficient documentation

## 2016-01-11 DIAGNOSIS — R0683 Snoring: Secondary | ICD-10-CM | POA: Diagnosis present

## 2016-01-11 DIAGNOSIS — G4730 Sleep apnea, unspecified: Secondary | ICD-10-CM

## 2016-01-28 DIAGNOSIS — L709 Acne, unspecified: Secondary | ICD-10-CM | POA: Diagnosis not present

## 2016-01-28 DIAGNOSIS — L281 Prurigo nodularis: Secondary | ICD-10-CM | POA: Diagnosis not present

## 2016-01-30 DIAGNOSIS — R0683 Snoring: Secondary | ICD-10-CM | POA: Diagnosis not present

## 2016-01-30 NOTE — Procedures (Signed)
  Patient Name: Kari Lewis, Kari Lewis Date: 01/11/2016 Gender: Female D.O.B: 1965/05/14 Age (years): 50 Referring Provider: Melida Quitter Height (inches): 51 Interpreting Physician: Baird Lyons MD, ABSM Weight (lbs): 205 RPSGT: Jacolyn Reedy BMI: 35 MRN: [id number] Neck Size: 14.00 CLINICAL INFORMATION Sleep Study Type: unattended HST   Indication for sleep study: Snoring, Witnessed Apneas   Epworth Sleepiness Score: 5 SLEEP STUDY TECHNIQUE A multi-channel overnight portable sleep study was performed. The channels recorded were: nasal airflow, thoracic respiratory movement, and oxygen saturation with a pulse oximetry. Snoring was also monitored.  MEDICATIONS Patient self administered medications include: none reported during sleep study.  SLEEP ARCHITECTURE Patient was studied for 460.0 minutes. The sleep efficiency was 98.8 % and the patient was supine for 12.4%. The arousal index was 0.0 per hour.  RESPIRATORY PARAMETERS The overall AHI was 45.8 per hour, with a central apnea index of 0.1 per hour. The oxygen nadir was 71% during sleep.  CARDIAC DATA Mean heart rate during sleep was 89.8 bpm.  IMPRESSIONS - Severe obstructive sleep apnea occurred during this study (AHI = 45.8/h). - No significant central sleep apnea occurred during this study (CAI = 0.1/h). - Oxygen desaturation was noted during this study (Min O2 = 71%). - Patient snored .  DIAGNOSIS - Obstructive Sleep Apnea (327.23 [G47.33 ICD-10]) - Nocturnal Hypoxemia (327.26 [G47.36 ICD-10])  RECOMMENDATIONS - CPAP titration is usually first choice of therapy with scores in this range. - Positional therapy avoiding supine position during sleep. - Avoid alcohol, sedatives and other CNS depressants that may worsen sleep apnea and disrupt normal sleep architecture. - Sleep hygiene should be reviewed to assess factors that may improve sleep quality. - Weight management and regular exercise should be  initiated or continued.  [Electronically signed] 01/30/2016 12:10 PM  Baird Lyons MD, [place], American Board of Sleep Medicine   NPI: [id number]  [place], American Board of Sleep Medicine  ELECTRONICALLY SIGNED ON:  01/30/2016, 12:11 PM [place] PH: [phone]   FX: [phone] [place]

## 2016-02-27 ENCOUNTER — Other Ambulatory Visit: Payer: Self-pay | Admitting: Family Medicine

## 2016-06-22 ENCOUNTER — Other Ambulatory Visit: Payer: Self-pay | Admitting: Family Medicine

## 2016-06-23 ENCOUNTER — Telehealth: Payer: Self-pay | Admitting: Family Medicine

## 2016-06-23 ENCOUNTER — Other Ambulatory Visit: Payer: Self-pay

## 2016-06-23 MED ORDER — VARENICLINE TARTRATE 1 MG PO TABS: 1.0000 mg | ORAL_TABLET | Freq: Two times a day (BID) | ORAL | 2 refills | Status: DC

## 2016-06-23 NOTE — Telephone Encounter (Signed)
Rx has been e-scribed in to preferred pharmacy. Thanks!

## 2016-06-23 NOTE — Telephone Encounter (Signed)
° ° °  Pt request refill of the following:  Pt is requesting CHANTIX said she had it last year    Phamacy: [facility]

## 2016-06-23 NOTE — Telephone Encounter (Signed)
Call in Chantix continuing pack, #[phone] with 2 rf

## 2016-06-23 NOTE — Telephone Encounter (Signed)
Is it okay to refill this medication?

## 2016-07-10 NOTE — Telephone Encounter (Signed)
Pt states pharm never received chantix rx please re send. [facility]

## 2016-07-10 NOTE — Telephone Encounter (Signed)
I spoke with pharmacy and they do have script on file from March 2018, I did leave a voice message for pt with this information.

## 2016-07-28 DIAGNOSIS — Z6835 Body mass index (BMI) 35.0-35.9, adult: Secondary | ICD-10-CM | POA: Diagnosis not present

## 2016-07-28 DIAGNOSIS — Z01419 Encounter for gynecological examination (general) (routine) without abnormal findings: Secondary | ICD-10-CM | POA: Diagnosis not present

## 2016-08-25 DIAGNOSIS — Z30433 Encounter for removal and reinsertion of intrauterine contraceptive device: Secondary | ICD-10-CM | POA: Diagnosis not present

## 2016-08-28 ENCOUNTER — Other Ambulatory Visit: Payer: Self-pay | Admitting: Family Medicine

## 2016-08-29 NOTE — Telephone Encounter (Signed)
Can we refill these? 

## 2016-09-21 DIAGNOSIS — L709 Acne, unspecified: Secondary | ICD-10-CM | POA: Diagnosis not present

## 2016-09-21 DIAGNOSIS — L281 Prurigo nodularis: Secondary | ICD-10-CM | POA: Diagnosis not present

## 2016-10-06 DIAGNOSIS — Z30431 Encounter for routine checking of intrauterine contraceptive device: Secondary | ICD-10-CM | POA: Diagnosis not present

## 2016-10-10 ENCOUNTER — Other Ambulatory Visit: Payer: Self-pay | Admitting: Family Medicine

## 2016-10-11 DIAGNOSIS — R3121 Asymptomatic microscopic hematuria: Secondary | ICD-10-CM | POA: Diagnosis not present

## 2016-10-13 ENCOUNTER — Encounter: Payer: Self-pay | Admitting: Family Medicine

## 2016-10-13 DIAGNOSIS — Z1231 Encounter for screening mammogram for malignant neoplasm of breast: Secondary | ICD-10-CM | POA: Diagnosis not present

## 2016-10-27 DIAGNOSIS — N3281 Overactive bladder: Secondary | ICD-10-CM | POA: Diagnosis not present

## 2016-10-27 DIAGNOSIS — R102 Pelvic and perineal pain: Secondary | ICD-10-CM | POA: Diagnosis not present

## 2017-02-04 ENCOUNTER — Encounter (HOSPITAL_COMMUNITY): Payer: Self-pay | Admitting: *Deleted

## 2017-02-04 ENCOUNTER — Ambulatory Visit (HOSPITAL_COMMUNITY)
Admission: EM | Admit: 2017-02-04 | Discharge: 2017-02-04 | Disposition: A | Discharge status: Home / Self Care | Payer: BLUE CROSS/BLUE SHIELD | Attending: Family Medicine | Admitting: Family Medicine

## 2017-02-04 DIAGNOSIS — D72829 Elevated white blood cell count, unspecified: Secondary | ICD-10-CM | POA: Diagnosis not present

## 2017-02-04 DIAGNOSIS — J029 Acute pharyngitis, unspecified: Secondary | ICD-10-CM | POA: Insufficient documentation

## 2017-02-04 DIAGNOSIS — F419 Anxiety disorder, unspecified: Secondary | ICD-10-CM | POA: Insufficient documentation

## 2017-02-04 DIAGNOSIS — B9789 Other viral agents as the cause of diseases classified elsewhere: Secondary | ICD-10-CM | POA: Insufficient documentation

## 2017-02-04 DIAGNOSIS — B349 Viral infection, unspecified: Secondary | ICD-10-CM

## 2017-02-04 DIAGNOSIS — Z87891 Personal history of nicotine dependence: Secondary | ICD-10-CM | POA: Insufficient documentation

## 2017-02-04 DIAGNOSIS — I10 Essential (primary) hypertension: Secondary | ICD-10-CM | POA: Insufficient documentation

## 2017-02-04 DIAGNOSIS — Z79899 Other long term (current) drug therapy: Secondary | ICD-10-CM | POA: Insufficient documentation

## 2017-02-04 LAB — POCT RAPID STREP A: STREPTOCOCCUS, GROUP A SCREEN (DIRECT): NEGATIVE

## 2017-02-04 MED ORDER — CEFDINIR 300 MG PO CAPS: 600.0000 mg | ORAL_CAPSULE | Freq: Every day | ORAL | 0 refills | Status: DC

## 2017-02-04 MED ORDER — CEFTRIAXONE SODIUM 1 G IJ SOLR
INTRAMUSCULAR | Status: AC
  Filled 2017-02-04: qty 10

## 2017-02-04 MED ORDER — CEFTRIAXONE SODIUM 1 G IJ SOLR
1.0000 g | Freq: Once | INTRAMUSCULAR | Status: AC
  Administered 2017-02-04: 1 g via INTRAMUSCULAR

## 2017-02-04 MED ORDER — LIDOCAINE HCL (PF) 1 % IJ SOLN
INTRAMUSCULAR | Status: AC
  Filled 2017-02-04: qty 2

## 2017-02-04 NOTE — ED Triage Notes (Signed)
C/O starting last night with severe sore throat, chills, SOB, difficulty swallowing.  Reports HR up to 160s at home.

## 2017-02-04 NOTE — Discharge Instructions (Signed)
We are running a second strep test that often takes two days.  In the meantime, start on the antibiotic and salt water gargles.

## 2017-02-04 NOTE — ED Provider Notes (Signed)
[facility]   [id number] 02/04/17 Arrival Time: 1652   SUBJECTIVE:  Kari Lewis is a 50 y.o. female who presents to the urgent care with complaint of severe sore throat that started last night and became much more intense as the day progressed.  She also has a rapid pulse and has the sense that her throat is closing up on her.     Past Medical History:  Diagnosis Date  . Allergy   . Anxiety   . Hypertension   . Leukocytosis    chronic & benign  . PPD positive, treated    with INH for 6 months   Family History  Problem Relation Age of Onset  . Breast cancer Unknown   . Coronary artery disease Unknown   . Diabetes Unknown   . Hyperlipidemia Unknown   . Hypertension Unknown   . Lung cancer Unknown   . Prostate cancer Unknown   . Stroke Unknown    Social History   Socioeconomic History  . Marital status: Married    Spouse name: Not on file  . Number of children: Not on file  . Years of education: Not on file  . Highest education level: Not on file  Social Needs  . Financial resource strain: Not on file  . Food insecurity - worry: Not on file  . Food insecurity - inability: Not on file  . Transportation needs - medical: Not on file  . Transportation needs - non-medical: Not on file  Occupational History  . Not on file  Tobacco Use  . Smoking status: Former Research scientist (life sciences)  . Smokeless tobacco: Never Used  Substance and Sexual Activity  . Alcohol use: No    Alcohol/week: 0.0 oz  . Drug use: No  . Sexual activity: Not on file  Other Topics Concern  . Not on file  Social History Narrative  . Not on file   Current Meds  Medication Sig  . amLODipine (NORVASC) 10 MG tablet TAKE 1 TABLET BY MOUTH DAILY  . hydrochlorothiazide (HYDRODIURIL) 25 MG tablet TAKE 1 TABLET BY MOUTH DAILY  . levonorgestrel (MIRENA) 20 MCG/24HR IUD 1 each by Intrauterine route once.  . Melatonin 5 MG SUBL Place 5 mg under the tongue.  . Multiple Vitamin (MULTIVITAMIN) tablet  Take 1 tablet by mouth daily.    . potassium chloride (K-DUR) 10 MEQ tablet TAKE 1 TABLET BY MOUTH EVERY DAY  . venlafaxine XR (EFFEXOR-XR) 150 MG 24 hr capsule TAKE ONE CAPSULE BY MOUTH DAILY   Allergies  Allergen Reactions  . Lisinopril     REACTION: cough      ROS: As per HPI, remainder of ROS negative.   OBJECTIVE:   Vitals:   02/04/17 1659 02/04/17 1700 02/04/17 1728  BP:  140/78   Pulse: (!) 124  (!) 120  Resp: 20    Temp: 98.5 F (36.9 C)    TempSrc: Oral    SpO2: 98%       General appearance: alert; no distress Eyes: PERRL; EOMI; conjunctiva normal HENT: normocephalic; atraumatic;  external ears normal without trauma; nasal mucosa normal; oral pharynx shows some mild swelling of tonsils without exudates. Neck: supple, no adenopathy Lungs: clear to auscultation bilaterally Heart: regular rate and rhythm Back: no CVA tenderness Extremities: no cyanosis or edema; symmetrical with no gross deformities Skin: warm and dry Neurologic: normal gait; grossly normal Psychological: alert and cooperative; normal mood and affect; anxious.     Labs:  Results for orders placed or  performed during the hospital encounter of 02/04/17  POCT rapid strep A [facility])  Result Value Ref Range   Streptococcus, Group A Screen (Direct) NEGATIVE NEGATIVE    Labs Reviewed  CULTURE, GROUP A STREP [facility])  POCT RAPID STREP A    No results found.     ASSESSMENT & PLAN:  1. Viral pharyngitis     Meds ordered this encounter  Medications  . cefTRIAXone (ROCEPHIN) injection 1 g  . cefdinir (OMNICEF) 300 MG capsule    Sig: Take 2 capsules (600 mg total) daily by mouth.    Dispense:  20 capsule    Refill:  0    Reviewed expectations re: course of current medical issues. Questions answered. Outlined signs and symptoms indicating need for more acute intervention. Patient verbalized understanding. After Visit Summary given.    Procedures:      Robyn Haber, MD 02/04/17 1739

## 2017-02-05 ENCOUNTER — Telehealth: Payer: Self-pay

## 2017-02-05 ENCOUNTER — Ambulatory Visit: Payer: BLUE CROSS/BLUE SHIELD | Admitting: Family Medicine

## 2017-02-05 ENCOUNTER — Encounter: Payer: Self-pay | Admitting: Family Medicine

## 2017-02-05 VITALS — BP 126/82 | HR 117 | Temp 99.2°F | Resp 12 | Ht 64.0 in | Wt 222.1 lb

## 2017-02-05 DIAGNOSIS — J069 Acute upper respiratory infection, unspecified: Secondary | ICD-10-CM | POA: Diagnosis not present

## 2017-02-05 DIAGNOSIS — J029 Acute pharyngitis, unspecified: Secondary | ICD-10-CM

## 2017-02-05 DIAGNOSIS — H65191 Other acute nonsuppurative otitis media, right ear: Secondary | ICD-10-CM

## 2017-02-05 LAB — POC INFLUENZA A&B (BINAX/QUICKVUE)
INFLUENZA A, POC: NEGATIVE
INFLUENZA B, POC: NEGATIVE

## 2017-02-05 MED ORDER — MAGIC MOUTHWASH W/LIDOCAINE: 5.0000 mL | Freq: Three times a day (TID) | ORAL | 0 refills | Status: AC | PRN

## 2017-02-05 MED ORDER — ACETAMINOPHEN-CODEINE 300-30 MG PO TABS: 1.0000 | ORAL_TABLET | Freq: Four times a day (QID) | ORAL | 0 refills | Status: AC | PRN

## 2017-02-05 NOTE — Patient Instructions (Signed)
  Ms.Shannell JLEIGH STRIPLIN I have seen you today for an acute visit.  A few things to remember from today's visit:   Acute non-suppurative otitis media, right - Plan: Acetaminophen-Codeine 300-30 MG tablet  URI, acute - Plan: Acetaminophen-Codeine 300-30 MG tablet  Acute sore throat - Plan: magic mouthwash w/lidocaine SOLN, Acetaminophen-Codeine 300-30 MG tablet   Medications prescribed today are intended for short period of time and will not be refill upon request, a follow up appointment might be necessary to discuss continuation of of treatment if appropriate.   Symptomatic treatment: Over the counter Acetaminophen 500 mg and/or Ibuprofen (400-600 mg) if there is not contraindications; you can alternate in between both every 4-6 hours. Gargles with saline water and throat lozenges might also help. Cold fluids.  viral infections are self-limited and we treat each symptom depending of severity.  Over the counter medications as decongestants and cold medications usually help, they need to be taken with caution if there is a history of high blood pressure or palpitations. Tylenol and/or Ibuprofen also helps with most symptoms (headache, muscle aching, fever,etc) Plenty of fluids. Honey helps with cough. Steam inhalations helps with runny nose, nasal congestion, and may prevent sinus infections. Cough and nasal congestion could last a few days and sometimes weeks. .   Seek prompt medical evaluation if you are having difficulty breathing, mouth swelling, throat closing up, not able to swallow liquids (drooling), skin rash/bruising, or worsening symptoms.        In general please monitor for signs of worsening symptoms and seek immediate medical attention if any concerning.    I hope you get better soon!

## 2017-02-05 NOTE — Progress Notes (Signed)
ACUTE VISIT  HPI:  Chief Complaint  Patient presents with  . Nasal Congestion  . Otalgia  . Bodyaches    [name] is a 51 y.o.female here today with her mother complaining of a day of severe sore throat , nasal congestion, severe bilateral constant earache, and myalgias.  She was evaluated yesterday in the ER, rapid strep was negative and throat culture is pending. She was started on Cefdinir 300 mg bid, she started it today,took 2 tabs. She is frustrated because symptoms have not improved.  Subjective fever.  Otalgia   There is pain in both ears. This is a new problem. The current episode started yesterday. The problem occurs constantly. The problem has been unchanged. The pain is at a severity of 10/10. Associated symptoms include ear discharge, rhinorrhea and a sore throat. Pertinent negatives include no abdominal pain, coughing, diarrhea, headaches, hearing loss, neck pain, rash or vomiting. She has tried NSAIDs and antibiotics for the symptoms. The treatment provided no relief. There is no history of hearing loss.   She feels like she cannot swallow, sore throat is worse upon doing so. OTC ibuprofen is not helping. She denies stridor or oral lesions.  No Hx of recent travel. 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 No known insect bite.  Hx of allergies: Yes.   Review of Systems  Constitutional: Positive for activity change, appetite change and fatigue. Negative for fever.  HENT: Positive for congestion, ear discharge, ear pain, postnasal drip, rhinorrhea and sore throat. Negative for facial swelling, hearing loss, mouth sores, nosebleeds, trouble swallowing and voice change.   Eyes: Negative for discharge, redness and visual disturbance.  Respiratory: Negative for cough, shortness of breath, wheezing and stridor.   Cardiovascular: Negative for leg swelling.  Gastrointestinal: Negative for abdominal pain, diarrhea, nausea and vomiting.  Genitourinary:  Negative for decreased urine volume, dysuria and hematuria.  Musculoskeletal: Positive for myalgias. Negative for gait problem, joint swelling and neck pain.  Skin: Negative for pallor and rash.  Allergic/Immunologic: Positive for environmental allergies.  Neurological: Negative for weakness, numbness and headaches.  Hematological: Negative for adenopathy. Does not bruise/bleed easily.  Psychiatric/Behavioral: Positive for sleep disturbance. Negative for confusion. The patient is nervous/anxious.       Current Outpatient Medications on File Prior to Visit  Medication Sig Dispense Refill  . amLODipine (NORVASC) 10 MG tablet TAKE 1 TABLET BY MOUTH DAILY 90 tablet 3  . cefdinir (OMNICEF) 300 MG capsule Take 2 capsules (600 mg total) daily by mouth. 20 capsule 0  . hydrochlorothiazide (HYDRODIURIL) 25 MG tablet TAKE 1 TABLET BY MOUTH DAILY 90 tablet 3  . levonorgestrel (MIRENA) 20 MCG/24HR IUD 1 each by Intrauterine route once.    . Melatonin 5 MG SUBL Place 5 mg under the tongue.    . Multiple Vitamin (MULTIVITAMIN) tablet Take 1 tablet by mouth daily.      . potassium chloride (K-DUR) 10 MEQ tablet TAKE 1 TABLET BY MOUTH EVERY DAY 90 tablet 3  . varenicline (CHANTIX CONTINUING MONTH PAK) 1 MG tablet Take 1 tablet (1 mg total) by mouth 2 (two) times daily. 60 tablet 2  . venlafaxine XR (EFFEXOR-XR) 150 MG 24 hr capsule TAKE ONE CAPSULE BY MOUTH DAILY 90 capsule 3   No current facility-administered medications on file prior to visit.      Past Medical History:  Diagnosis Date  . Allergy   . Anxiety   . Hypertension   . Leukocytosis  chronic & benign  . PPD positive, treated    with INH for 6 months   Allergies  Allergen Reactions  . Lisinopril     REACTION: cough    Social History   Socioeconomic History  . Marital status: Married    Spouse name: None  . Number of children: None  . Years of education: None  . Highest education level: None  Social Needs  . Financial  resource strain: None  . Food insecurity - worry: None  . Food insecurity - inability: None  . Transportation needs - medical: None  . Transportation needs - non-medical: None  Occupational History  . None  Tobacco Use  . Smoking status: Former Research scientist (life sciences)  . Smokeless tobacco: Never Used  Substance and Sexual Activity  . Alcohol use: No    Alcohol/week: 0.0 oz  . Drug use: No  . Sexual activity: None  Other Topics Concern  . None  Social History Narrative  . None    Vitals:   02/05/17 0752  BP: 126/82  Pulse: (!) 117  Resp: 12  Temp: 99.2 F (37.3 C)  SpO2: 97%   Body mass index is 38.13 kg/m.   Physical Exam  Nursing note and vitals reviewed. Constitutional: She is oriented to person, place, and time. She appears well-developed. She does not appear ill. No distress.  HENT:  Head: Normocephalic and atraumatic.  Right Ear: External ear and ear canal normal. No tenderness. No mastoid tenderness. Tympanic membrane is erythematous. Tympanic membrane is not bulging.  Left Ear: Tympanic membrane, external ear and ear canal normal. No tenderness. No mastoid tenderness.  Nose: Right sinus exhibits no maxillary sinus tenderness and no frontal sinus tenderness. Left sinus exhibits no maxillary sinus tenderness and no frontal sinus tenderness.  Mouth/Throat: Uvula is midline and mucous membranes are normal. Posterior oropharyngeal erythema present. No oropharyngeal exudate or posterior oropharyngeal edema.  No dysphonia.   Eyes: Conjunctivae are normal. Pupils are equal, round, and reactive to light.  Neck: No muscular tenderness present. No edema and no erythema present.  Cardiovascular: Regular rhythm. Tachycardia present.  No murmur heard. Respiratory: Effort normal and breath sounds normal. No stridor. No respiratory distress.  Lymphadenopathy:       Head (right side): No submandibular, no preauricular and no posterior auricular adenopathy present.       Head (left side): No  submandibular, no preauricular and no posterior auricular adenopathy present.    She has cervical adenopathy.       Right cervical: Posterior cervical adenopathy present.       Left cervical: Posterior cervical adenopathy present.  Neurological: She is alert and oriented to person, place, and time. She has normal strength.  Skin: Skin is warm. No rash noted. No erythema.  Psychiatric: Her mood appears anxious.  Fairly groomed, good eye contact.    ASSESSMENT AND PLAN:   Kari Lewis was seen today for nasal congestion, otalgia and bodyaches.  Diagnoses and all orders for this visit:  Acute non-suppurative otitis media, right  Possible etiologies discussed, most likely viral. She is on oral antibiotic, explained that it helps in case of bacterial infection but it will take 48-72 hours for symptoms to resolve. Side effects of Codeine discussed. Continue Advil OTC tid prn (200-800 mg). Instructed about warning signs. F/U with PCP in 7 days.  -     Acetaminophen-Codeine 300-30 MG tablet; Take 1 tablet every 6 (six) hours as needed for up to 5 days by mouth for pain.  URI, acute  Rapid flu test negative. Symptoms suggests a viral etiology, symptomatic treatment recommended.  Instructed to monitor for signs of complications,clearly instructed about warning signs. Monitor for new symptoms. F/U with PCP in 7 days.   -     Acetaminophen-Codeine 300-30 MG tablet; Take 1 tablet every 6 (six) hours as needed for up to 5 days by mouth for pain. -     POC Influenza A&B (Binax test)  Acute sore throat  Continue oral antibiotic even though it could be viral. Symptomatic treatment with Magic mouth and oral Tylenol 3 as well as with cold fluids. We will follow throat Cx. Instructed about warning signs.  -     magic mouthwash w/lidocaine SOLN; Take 5 mLs 3 (three) times daily as needed for up to 10 days by mouth for mouth pain. -     Acetaminophen-Codeine 300-30 MG tablet; Take 1 tablet  every 6 (six) hours as needed for up to 5 days by mouth for pain.     -Ms. KATELYNN HEIDLER advised to seek attention immediately if symptoms worsen.      Betty G. Martinique, MD  [facility]. [place] office.

## 2017-02-05 NOTE — Telephone Encounter (Signed)
[facility] called for clarification on Magic Mouthwash. The ingredients do not add up to 122mL. Please correct and resend.

## 2017-02-05 NOTE — Telephone Encounter (Signed)
Talked to pharmacy about rx change.

## 2017-02-05 NOTE — Telephone Encounter (Signed)
Change each ingredient to 50 ml (Lidocaine,Maalox,and Benadryl) gargle with 5 ml tid as needed, do not swallow. Thanks, BJ

## 2017-02-07 LAB — CULTURE, GROUP A STREP (THRC)

## 2017-02-12 ENCOUNTER — Encounter: Payer: Self-pay | Admitting: Family Medicine

## 2017-02-12 ENCOUNTER — Ambulatory Visit: Payer: BLUE CROSS/BLUE SHIELD | Admitting: Family Medicine

## 2017-02-12 VITALS — BP 138/88 | Temp 98.0°F | Ht 64.0 in | Wt 221.0 lb

## 2017-02-12 DIAGNOSIS — H6980 Other specified disorders of Eustachian tube, unspecified ear: Secondary | ICD-10-CM | POA: Diagnosis not present

## 2017-02-12 DIAGNOSIS — J069 Acute upper respiratory infection, unspecified: Secondary | ICD-10-CM | POA: Diagnosis not present

## 2017-02-12 NOTE — Progress Notes (Signed)
   Subjective:    Patient ID: Kari Lewis, female    DOB: Oct 11, 1965, 51 y.o.   MRN: [id number]  HPI Here for several issues. First she has had decreased hearing in the right ear for the past 2 days. She is getting over an URI that was probably viral. On 02-04-17 she was seen at urgent care and her OP was red. She was given a steroid shot and was started on Cefdinir.  A rapid strep test was negative and the culture returned as negative for strep. She was then seen here on 02-05-17 and had red TMs. She was told to stay on the Cefdinir, which she has done. Now all these symptoms have resolved except for the hearing problem.    Review of Systems  Constitutional: Negative.   HENT: Positive for hearing loss. Negative for congestion, ear pain, postnasal drip, sinus pressure, sinus pain and sore throat.   Eyes: Negative.   Respiratory: Negative.        Objective:   Physical Exam  Constitutional: She appears well-developed and well-nourished.  HENT:  Right Ear: External ear normal.  Left Ear: External ear normal.  Nose: Nose normal.  Mouth/Throat: Oropharynx is clear and moist.  Eyes: Conjunctivae are normal.  Neck: No thyromegaly present.  Pulmonary/Chest: Effort normal and breath sounds normal. No respiratory distress. She has no wheezes. She has no rales.  Lymphadenopathy:    She has no cervical adenopathy.          Assessment & Plan:  Her viral URI appears to have resolved. She now has eustachian tube dysfunction. Try Zyrtec D for several days. Recheck prn.  Alysia Penna, MD

## 2017-02-12 NOTE — Patient Instructions (Signed)
WE NOW OFFER   Kari Lewis's FAST TRACK!!!  SAME DAY Appointments for ACUTE CARE  Such as: Sprains, Injuries, cuts, abrasions, rashes, muscle pain, joint pain, back pain Colds, flu, sore throats, headache, allergies, cough, fever  Ear pain, sinus and eye infections Abdominal pain, nausea, vomiting, diarrhea, upset stomach Animal/insect bites  3 Easy Ways to Schedule: Walk-In Scheduling Call in scheduling Mychart Sign-up: [email or web address]         

## 2017-03-05 ENCOUNTER — Telehealth: Payer: Self-pay | Admitting: Family Medicine

## 2017-03-05 NOTE — Telephone Encounter (Signed)
Copied from [place]. Topic: Quick Communication - See Telephone Encounter >> Mar 05, 2017  2:44 PM Hewitt Shorts wrote: CRM for notification. See Telephone encounter for:  03/05/17.

## 2017-03-06 NOTE — Telephone Encounter (Signed)
Left message on voice mail to call back regarding her medication questions

## 2017-03-07 MED ORDER — ESCITALOPRAM OXALATE 10 MG PO TABS: 10.0000 mg | ORAL_TABLET | Freq: Every day | ORAL | 2 refills | Status: DC

## 2017-03-07 NOTE — Telephone Encounter (Signed)
Call in Lexapro 10 mg daily, #30 with 2 rf  

## 2017-03-07 NOTE — Telephone Encounter (Signed)
Rx sent. Called pt and left a VM Rx was sent to pharmacy.

## 2017-03-07 NOTE — Telephone Encounter (Signed)
Sent to PCP for approval.  

## 2017-03-07 NOTE — Telephone Encounter (Signed)
Relation to pt: self Call back number: [phone]   Reason for call:  Patient returned call, please advise

## 2017-03-07 NOTE — Telephone Encounter (Signed)
Spoke with patient- she states she discussed a medication for her nerves with the provider when she was in the office. She would like to get that Rx please. She uses Psychologist, counselling.

## 2017-05-29 ENCOUNTER — Ambulatory Visit: Payer: BLUE CROSS/BLUE SHIELD | Admitting: Family Medicine

## 2017-05-29 ENCOUNTER — Encounter: Payer: Self-pay | Admitting: Family Medicine

## 2017-05-29 VITALS — BP 124/78 | HR 102 | Temp 98.0°F | Wt 211.0 lb

## 2017-05-29 DIAGNOSIS — J018 Other acute sinusitis: Secondary | ICD-10-CM | POA: Diagnosis not present

## 2017-05-29 MED ORDER — AMOXICILLIN-POT CLAVULANATE 875-125 MG PO TABS: 1.0000 | ORAL_TABLET | Freq: Two times a day (BID) | ORAL | 0 refills | Status: DC

## 2017-05-29 NOTE — Progress Notes (Signed)
   Subjective:    Patient ID: Kari Lewis, female    DOB: Apr 25, 1965, 52 y.o.   MRN: [id number]  HPI Here for 3 months of recurrent sinus pressure, stuffy ears, PND, ST , and dry cough. This started up again last night.    Review of Systems  Constitutional: Negative.   HENT: Positive for congestion, postnasal drip, sinus pressure, sinus pain and sore throat.   Eyes: Negative.   Respiratory: Positive for cough.        Objective:   Physical Exam  Constitutional: She is oriented to person, place, and time. She appears well-developed and well-nourished.  HENT:  Right Ear: External ear normal.  Left Ear: External ear normal.  Nose: Nose normal.  Mouth/Throat: Oropharynx is clear and moist.  Eyes: Conjunctivae are normal.  Neck: No thyromegaly present.  Pulmonary/Chest: Effort normal and breath sounds normal. No respiratory distress. She has no wheezes. She has no rales.  Lymphadenopathy:    She has no cervical adenopathy.  Neurological: She is alert and oriented to person, place, and time.          Assessment & Plan:  Sinusitis, treat with Augmentin. Add Flonase and Mucinex prn.  Alysia Penna, MD

## 2017-06-04 ENCOUNTER — Other Ambulatory Visit: Payer: Self-pay | Admitting: Family Medicine

## 2017-06-11 ENCOUNTER — Telehealth: Payer: Self-pay | Admitting: Family Medicine

## 2017-06-11 ENCOUNTER — Ambulatory Visit: Payer: Self-pay

## 2017-06-11 DIAGNOSIS — H9202 Otalgia, left ear: Secondary | ICD-10-CM

## 2017-06-11 NOTE — Telephone Encounter (Signed)
Sent to PCP to advised  

## 2017-06-11 NOTE — Telephone Encounter (Signed)
I referred her back to Dr. Redmond Baseman (ENT)

## 2017-06-11 NOTE — Telephone Encounter (Signed)
Pt. Reports her left ear "is still clogged. I can't hear out of it still." Reports finished her antibiotic - used decongestant and nasal spray as directed. States "It's driving me crazy." Wants to know if Dr. Sarajane Jews thinks she needs an ENT referral or does he want to see her. Denies fever or any sinus congestion today.

## 2017-06-11 NOTE — Telephone Encounter (Signed)
Left VM to call back if she would like to  Speak with a nurse.

## 2017-06-12 NOTE — Telephone Encounter (Signed)
Called and spoke with pt. Pt advised and voiced understanding.  

## 2017-07-24 DIAGNOSIS — H5213 Myopia, bilateral: Secondary | ICD-10-CM | POA: Diagnosis not present

## 2017-08-24 ENCOUNTER — Other Ambulatory Visit: Payer: Self-pay | Admitting: Family Medicine

## 2017-09-18 DIAGNOSIS — S92215A Nondisplaced fracture of cuboid bone of left foot, initial encounter for closed fracture: Secondary | ICD-10-CM | POA: Diagnosis not present

## 2017-10-02 DIAGNOSIS — S92215D Nondisplaced fracture of cuboid bone of left foot, subsequent encounter for fracture with routine healing: Secondary | ICD-10-CM | POA: Diagnosis not present

## 2017-10-03 ENCOUNTER — Other Ambulatory Visit: Payer: Self-pay | Admitting: Family Medicine

## 2017-10-08 ENCOUNTER — Other Ambulatory Visit: Payer: Self-pay | Admitting: Family Medicine

## 2017-10-15 DIAGNOSIS — Z1231 Encounter for screening mammogram for malignant neoplasm of breast: Secondary | ICD-10-CM | POA: Diagnosis not present

## 2017-10-15 LAB — HM MAMMOGRAPHY

## 2017-10-18 ENCOUNTER — Encounter: Payer: Self-pay | Admitting: Family Medicine

## 2017-10-18 DIAGNOSIS — R922 Inconclusive mammogram: Secondary | ICD-10-CM | POA: Diagnosis not present

## 2017-10-18 DIAGNOSIS — Z0189 Encounter for other specified special examinations: Secondary | ICD-10-CM | POA: Diagnosis not present

## 2017-10-18 DIAGNOSIS — Z9889 Other specified postprocedural states: Secondary | ICD-10-CM | POA: Diagnosis not present

## 2017-10-30 ENCOUNTER — Encounter: Payer: Self-pay | Admitting: Family Medicine

## 2017-10-30 ENCOUNTER — Ambulatory Visit: Payer: BLUE CROSS/BLUE SHIELD | Admitting: Family Medicine

## 2017-10-30 VITALS — BP 122/80 | HR 108 | Temp 97.8°F | Ht 64.0 in | Wt 218.8 lb

## 2017-10-30 DIAGNOSIS — R000 Tachycardia, unspecified: Secondary | ICD-10-CM

## 2017-10-30 DIAGNOSIS — G4733 Obstructive sleep apnea (adult) (pediatric): Secondary | ICD-10-CM | POA: Diagnosis not present

## 2017-10-30 DIAGNOSIS — I10 Essential (primary) hypertension: Secondary | ICD-10-CM | POA: Diagnosis not present

## 2017-10-30 LAB — HEPATIC FUNCTION PANEL
ALK PHOS: 96 U/L (ref 39–117)
ALT: 16 U/L (ref 0–35)
AST: 16 U/L (ref 0–37)
Albumin: 4.5 g/dL (ref 3.5–5.2)
BILIRUBIN DIRECT: 0 mg/dL (ref 0.0–0.3)
Total Bilirubin: 0.3 mg/dL (ref 0.2–1.2)
Total Protein: 7.3 g/dL (ref 6.0–8.3)

## 2017-10-30 LAB — BASIC METABOLIC PANEL
BUN: 14 mg/dL (ref 6–23)
CALCIUM: 9.8 mg/dL (ref 8.4–10.5)
CO2: 27 meq/L (ref 19–32)
Chloride: 101 mEq/L (ref 96–112)
Creatinine, Ser: 0.7 mg/dL (ref 0.40–1.20)
GFR: 93.71 mL/min (ref >60.00)
Glucose, Bld: 155 mg/dL — ABNORMAL HIGH (ref 70–99)
POTASSIUM: 4.1 meq/L (ref 3.5–5.1)
SODIUM: 137 meq/L (ref 135–145)

## 2017-10-30 LAB — CBC WITH DIFFERENTIAL/PLATELET
BASOS PCT: 0.8 % (ref 0.0–3.0)
Basophils Absolute: 0.1 10*3/uL (ref 0.0–0.1)
EOS PCT: 5 % (ref 0.0–5.0)
Eosinophils Absolute: 0.5 10*3/uL (ref 0.0–0.7)
HCT: 40.4 % (ref 36.0–46.0)
Hemoglobin: 13.8 g/dL (ref 12.0–15.0)
LYMPHS ABS: 3.5 10*3/uL (ref 0.7–4.0)
Lymphocytes Relative: 36.5 % (ref 12.0–46.0)
MCHC: 34.1 g/dL (ref 30.0–36.0)
MCV: 84.6 fl (ref 78.0–100.0)
MONO ABS: 0.6 10*3/uL (ref 0.1–1.0)
MONOS PCT: 6.7 % (ref 3.0–12.0)
NEUTROS ABS: 4.9 10*3/uL (ref 1.4–7.7)
NEUTROS PCT: 51 % (ref 43.0–77.0)
Platelets: 470 10*3/uL — ABNORMAL HIGH (ref 150.0–400.0)
RBC: 4.77 Mil/uL (ref 3.87–5.11)
RDW: 13.1 % (ref 11.5–15.5)
WBC: 9.6 10*3/uL (ref 4.0–10.5)

## 2017-10-30 LAB — T3, FREE: T3, Free: 3.2 pg/mL (ref 2.3–4.2)

## 2017-10-30 LAB — TSH: TSH: 2.93 u[IU]/mL (ref 0.35–4.50)

## 2017-10-30 LAB — T4, FREE: Free T4: 0.79 ng/dL (ref 0.60–1.60)

## 2017-10-30 MED ORDER — METOPROLOL SUCCINATE ER 50 MG PO TB24: 50.0000 mg | ORAL_TABLET | Freq: Every day | ORAL | 3 refills | Status: DC

## 2017-10-30 NOTE — Progress Notes (Signed)
   Subjective:    Patient ID: Kari Lewis, female    DOB: [birth year], 52 y.o.   MRN: [id number]  HPI Here with concerns about her heart rate. Her BP has been well controlled for several years. She recently began wearing an Apple watch and she has noticed her heart rate is often in the 90s or 100s even at rest. She feels fine. No chest pain or fluttering or SOB. Of note she had a sleep study in October 2017 showing severe obstructive sleep apnea and it was recommended that she use a CPAP machine. For some reason this never happened and she did not follow up. She admits to feeling sleepy all day.    Review of Systems  Constitutional: Positive for fatigue.  Respiratory: Negative.   Cardiovascular: Negative.   Neurological: Negative.        Objective:   Physical Exam  Constitutional: She is oriented to person, place, and time. She appears well-developed and well-nourished.  Neck: No thyromegaly present.  Cardiovascular: Regular rhythm, normal heart sounds and intact distal pulses.  Rapid rate. EKG shows sinus tachycardia .   Pulmonary/Chest: Effort normal and breath sounds normal. No stridor. No respiratory distress. She has no wheezes. She has no rales.  Lymphadenopathy:    She has no cervical adenopathy.  Neurological: She is alert and oriented to person, place, and time.          Assessment & Plan:  Her HTN is well controlled but her heart rates are too high. We will screen her with labs for anemia, thyroid disorders, etc but I suspect the main problem is untreated obstructive sleep apnea. We will stop Amlodipine and substitute Metoprolol succinate 50 mg daily. She may well end up on 100 mg or so, but we will see her for follow up in 3 weeks. We will ave her meet with Dr. Baird Lyons again soon to treat the sleep apnea.  Kari Penna, MD

## 2017-11-12 ENCOUNTER — Other Ambulatory Visit: Payer: Self-pay

## 2017-11-12 ENCOUNTER — Telehealth: Payer: Self-pay

## 2017-11-12 ENCOUNTER — Other Ambulatory Visit (INDEPENDENT_AMBULATORY_CARE_PROVIDER_SITE_OTHER): Payer: BLUE CROSS/BLUE SHIELD

## 2017-11-12 DIAGNOSIS — Z0000 Encounter for general adult medical examination without abnormal findings: Secondary | ICD-10-CM | POA: Diagnosis not present

## 2017-11-12 LAB — HEMOGLOBIN A1C: HEMOGLOBIN A1C: 6.6 % — AB (ref 4.6–6.5)

## 2017-11-12 NOTE — Telephone Encounter (Signed)
Pt scheduled for 11/30/17.    Copied from [place] [id number]. Topic: Referral - Status >> Nov 12, 2017 11:19 AM Yvette Rack wrote: Reason for CRM: Pt states she has not received a call from Dr. Janee Morn office and she would like to get an appt scheduled. Pt requests call back. Cb# [phone]

## 2017-11-15 DIAGNOSIS — S92215D Nondisplaced fracture of cuboid bone of left foot, subsequent encounter for fracture with routine healing: Secondary | ICD-10-CM | POA: Diagnosis not present

## 2017-11-15 DIAGNOSIS — M25561 Pain in right knee: Secondary | ICD-10-CM | POA: Diagnosis not present

## 2017-11-20 ENCOUNTER — Encounter: Payer: Self-pay | Admitting: Family Medicine

## 2017-11-20 ENCOUNTER — Ambulatory Visit: Payer: BLUE CROSS/BLUE SHIELD | Admitting: Family Medicine

## 2017-11-20 VITALS — BP 124/88 | HR 84 | Temp 98.2°F | Ht 64.0 in | Wt 216.2 lb

## 2017-11-20 DIAGNOSIS — R000 Tachycardia, unspecified: Secondary | ICD-10-CM

## 2017-11-20 NOTE — Progress Notes (Signed)
   Subjective:    Patient ID: Kari Lewis, female    DOB: 1966/03/09, 52 y.o.   MRN: [id number]  HPI Here to follow up. She started on Metoprolol 3 weeks ago for resting tacycardia, and she is very happy with the results. Her heart rate now stays in the 70s or 80s, and her BP has remained stable. She does mention some diarrhea that started 3 days ago. She does not feel sick and has no other symptoms.    Review of Systems  Constitutional: Negative.   Respiratory: Negative.   Cardiovascular: Negative.   Gastrointestinal: Positive for diarrhea. Negative for abdominal distention, abdominal pain, nausea and vomiting.  Neurological: Negative.        Objective:   Physical Exam  Constitutional: She is oriented to person, place, and time. She appears well-developed and well-nourished.  Cardiovascular: Normal rate, regular rhythm, normal heart sounds and intact distal pulses.  Pulmonary/Chest: Effort normal and breath sounds normal. No stridor. No respiratory distress. She has no wheezes. She has no rales.  Abdominal: Soft. Bowel sounds are normal. She exhibits no distension and no mass. There is no tenderness. There is no rebound and no guarding.  Neurological: She is alert and oriented to person, place, and time.          Assessment & Plan:  Her tachycardia has responded well to Metoprolol. The diarrhea may be a side effect of this, but it does not bother her very much. We agreed to stay on the current regimen. Hopefull the diarrhea will resolve. If not she will let us know. Alysia Penna, MD

## 2017-11-29 ENCOUNTER — Institutional Professional Consult (permissible substitution): Payer: BLUE CROSS/BLUE SHIELD | Admitting: Pulmonary Disease

## 2017-11-30 ENCOUNTER — Encounter: Payer: Self-pay | Admitting: Pulmonary Disease

## 2017-11-30 ENCOUNTER — Ambulatory Visit (INDEPENDENT_AMBULATORY_CARE_PROVIDER_SITE_OTHER): Payer: BLUE CROSS/BLUE SHIELD | Admitting: Pulmonary Disease

## 2017-11-30 VITALS — BP 138/86 | HR 99 | Ht 63.78 in | Wt 218.0 lb

## 2017-11-30 DIAGNOSIS — G4733 Obstructive sleep apnea (adult) (pediatric): Secondary | ICD-10-CM | POA: Diagnosis not present

## 2017-11-30 NOTE — Progress Notes (Signed)
Kari Lewis    [id number]    10/13/1965  Primary Care Physician:Fry, Ishmael Holter, MD  Referring Physician: Laurey Morale, MD [place], [place] [address]  Chief complaint:   Resting tachycardia Known severe obstructive sleep apnea  HPI:  Diagnosed with severe obstructive sleep apnea in 2017 She did have some difficulty getting used to CPAP at that time She no longer has the machine Weight has remained about the same Usually goes to bed about 11 PM, sometimes take about an hour to fall asleep Wakes up about 6:30 in the morning Wakes up about 2-3 times a night  Usually takes about a 2-hour nap in the afternoon   Occupation: Teacher Exposures: Significant exposure Smoking history: Non-smoker  Outpatient Encounter Medications as of 11/30/2017  Medication Sig  . escitalopram (LEXAPRO) 10 MG tablet TAKE 1 TABLET(10 MG) BY MOUTH DAILY  . hydrochlorothiazide (HYDRODIURIL) 25 MG tablet TAKE 1 TABLET BY MOUTH DAILY  . levonorgestrel (MIRENA) 20 MCG/24HR IUD 1 each by Intrauterine route once.  . Melatonin 5 MG SUBL Place 5 mg under the tongue.  . metoprolol succinate (TOPROL-XL) 50 MG 24 hr tablet Take 1 tablet (50 mg total) by mouth daily. Take with or immediately following a meal.  . Multiple Vitamin (MULTIVITAMIN) tablet Take 1 tablet by mouth daily.    . potassium chloride (K-DUR) 10 MEQ tablet TAKE 1 TABLET BY MOUTH EVERY DAY  . venlafaxine XR (EFFEXOR-XR) 150 MG 24 hr capsule TAKE ONE CAPSULE BY MOUTH DAILY   No facility-administered encounter medications on file as of 11/30/2017.     Allergies as of 11/30/2017 - Review Complete 11/30/2017  Allergen Reaction Noted  . Lisinopril  02/11/2009    Past Medical History:  Diagnosis Date  . Allergy   . Anxiety   . Hypertension   . Leukocytosis    chronic & benign  . PPD positive, treated    with INH for 6 months    Past Surgical History:  Procedure Laterality Date  . BIOPSY BREAST       benign 1996    Family History  Problem Relation Age of Onset  . Breast cancer Unknown   . Coronary artery disease Unknown   . Diabetes Unknown   . Hyperlipidemia Unknown   . Hypertension Unknown   . Lung cancer Unknown   . Prostate cancer Unknown   . Stroke Unknown     Social History   Socioeconomic History  . Marital status: Married    Spouse name: Not on file  . Number of children: Not on file  . Years of education: Not on file  . Highest education level: Not on file  Occupational History  . Not on file  Social Needs  . Financial resource strain: Not on file  . Food insecurity:    Worry: Not on file    Inability: Not on file  . Transportation needs:    Medical: Not on file    Non-medical: Not on file  Tobacco Use  . Smoking status: Former Research scientist (life sciences)  . Smokeless tobacco: Never Used  Substance and Sexual Activity  . Alcohol use: No    Alcohol/week: 0.0 standard drinks  . Drug use: No  . Sexual activity: Not on file  Lifestyle  . Physical activity:    Days per week: Not on file    Minutes per session: Not on file  . Stress: Not on file  Relationships  .  Social connections:    Talks on phone: Not on file    Gets together: Not on file    Attends religious service: Not on file    Active member of club or organization: Not on file    Attends meetings of clubs or organizations: Not on file    Relationship status: Not on file  . Intimate partner violence:    Fear of current or ex partner: Not on file    Emotionally abused: Not on file    Physically abused: Not on file    Forced sexual activity: Not on file  Other Topics Concern  . Not on file  Social History Narrative  . Not on file    Review of Systems  Constitutional: Negative.   HENT: Negative.   Eyes: Negative.   Respiratory: Positive for apnea.   Cardiovascular:       Resting tachycardia  Endocrine: Negative.   Genitourinary: Negative.   Allergic/Immunologic: Negative.   Neurological: Negative.      Vitals:   11/30/17 1053  BP: 138/86  Pulse: 99  SpO2: 96%     Physical Exam  Constitutional: She is oriented to person, place, and time. She appears well-developed and well-nourished. No distress.  HENT:  Head: Normocephalic.  Mallampati 4  Eyes: Pupils are equal, round, and reactive to light. Conjunctivae and EOM are normal. Right eye exhibits no discharge.  Neck: Normal range of motion. Neck supple. No tracheal deviation present. No thyromegaly present.  Cardiovascular: Normal rate and regular rhythm.  Pulmonary/Chest: Effort normal. No respiratory distress. She has no wheezes.  Abdominal: Soft. She exhibits no distension. There is no tenderness.  Musculoskeletal: Normal range of motion. She exhibits no edema.  Neurological: She is alert and oriented to person, place, and time. No cranial nerve deficit.  Skin: Skin is warm and dry. No erythema.  Psychiatric: She has a normal mood and affect.     Data Reviewed: Home sleep study from 01/30/2016 reviewed by myself  Assessment:  Known obstructive sleep apnea  Obesity  Resting tachycardia  Difficult to control hypertension  Plan/Recommendations: .  We will schedule a home sleep study  .  Weight loss efforts encouraged  .  Pathophysiology of sleep disordered breathing discussed  .  Treatment options of sleep disordered breathing discussed  Cardiovascular risks associated with untreated sleep apnea was discussed   Sherrilyn Rist MD [facility] 11/30/2017, 11:03 AM  CC: Laurey Morale, MD

## 2017-11-30 NOTE — Patient Instructions (Signed)
Severe obstructive sleep apnea  Hypertension  Resting tachycardia   We will obtain a home sleep study to assess severity of obstructive sleep apnea and plan for an auto titrating CPAP  I will see you back about 4 to 6 weeks after initiation of treatment

## 2017-12-06 DIAGNOSIS — M25561 Pain in right knee: Secondary | ICD-10-CM | POA: Diagnosis not present

## 2017-12-18 DIAGNOSIS — G4733 Obstructive sleep apnea (adult) (pediatric): Secondary | ICD-10-CM

## 2017-12-19 ENCOUNTER — Other Ambulatory Visit: Payer: Self-pay | Admitting: *Deleted

## 2017-12-19 DIAGNOSIS — G4733 Obstructive sleep apnea (adult) (pediatric): Secondary | ICD-10-CM

## 2017-12-21 ENCOUNTER — Telehealth: Payer: Self-pay | Admitting: Pulmonary Disease

## 2017-12-21 DIAGNOSIS — G4733 Obstructive sleep apnea (adult) (pediatric): Secondary | ICD-10-CM

## 2017-12-21 NOTE — Telephone Encounter (Signed)
Per AO, he does recommend CPAP treatment for severe OSA. He wishes for her to have a cpap titration test as the next step .   Left message for patient to call back.

## 2017-12-21 NOTE — Telephone Encounter (Signed)
Patient is aware of these results and is okay with cpap titration order has been placed.

## 2017-12-31 ENCOUNTER — Other Ambulatory Visit: Payer: Self-pay | Admitting: Family Medicine

## 2018-01-03 ENCOUNTER — Other Ambulatory Visit: Payer: Self-pay | Admitting: Family Medicine

## 2018-01-26 ENCOUNTER — Encounter (HOSPITAL_BASED_OUTPATIENT_CLINIC_OR_DEPARTMENT_OTHER): Payer: BLUE CROSS/BLUE SHIELD

## 2018-01-30 ENCOUNTER — Other Ambulatory Visit: Payer: Self-pay | Admitting: Family Medicine

## 2018-02-07 ENCOUNTER — Encounter (HOSPITAL_BASED_OUTPATIENT_CLINIC_OR_DEPARTMENT_OTHER): Payer: BLUE CROSS/BLUE SHIELD

## 2018-02-08 NOTE — Telephone Encounter (Signed)
Patient states she went to the pharmacy and they still do not have her RX for escitalopram (LEXAPRO) 10 MG tablet. Please advise.

## 2018-02-19 ENCOUNTER — Other Ambulatory Visit: Payer: Self-pay | Admitting: Family Medicine

## 2018-02-19 MED ORDER — ESCITALOPRAM OXALATE 10 MG PO TABS: ORAL_TABLET | ORAL | 5 refills | Status: DC

## 2018-02-19 NOTE — Addendum Note (Signed)
Addended by: Matilde Sprang on: 02/19/2018 03:04 PM   Modules accepted: Orders

## 2018-02-19 NOTE — Telephone Encounter (Signed)
Lilia at [facility] calling.  States that pt has been without medication for 2 weeks.  They are wanting to know if they can refill.

## 2018-02-19 NOTE — Telephone Encounter (Signed)
[facility] called and spoke to [place], [place]. I advised I just resubmitted electronically Lexapro, she says it was received.

## 2018-02-27 ENCOUNTER — Other Ambulatory Visit: Payer: Self-pay | Admitting: Family Medicine

## 2018-04-06 ENCOUNTER — Other Ambulatory Visit: Payer: Self-pay | Admitting: Family Medicine

## 2018-04-07 ENCOUNTER — Other Ambulatory Visit: Payer: Self-pay | Admitting: Family Medicine

## 2018-04-11 ENCOUNTER — Encounter (HOSPITAL_BASED_OUTPATIENT_CLINIC_OR_DEPARTMENT_OTHER): Payer: BLUE CROSS/BLUE SHIELD

## 2018-04-23 DIAGNOSIS — L739 Follicular disorder, unspecified: Secondary | ICD-10-CM | POA: Diagnosis not present

## 2018-05-03 DIAGNOSIS — Z8042 Family history of malignant neoplasm of prostate: Secondary | ICD-10-CM | POA: Diagnosis not present

## 2018-05-03 DIAGNOSIS — Z801 Family history of malignant neoplasm of trachea, bronchus and lung: Secondary | ICD-10-CM | POA: Diagnosis not present

## 2018-05-03 DIAGNOSIS — Z01419 Encounter for gynecological examination (general) (routine) without abnormal findings: Secondary | ICD-10-CM | POA: Diagnosis not present

## 2018-05-03 DIAGNOSIS — Z01411 Encounter for gynecological examination (general) (routine) with abnormal findings: Secondary | ICD-10-CM | POA: Diagnosis not present

## 2018-05-03 DIAGNOSIS — Z6838 Body mass index (BMI) 38.0-38.9, adult: Secondary | ICD-10-CM | POA: Diagnosis not present

## 2018-05-03 DIAGNOSIS — Z803 Family history of malignant neoplasm of breast: Secondary | ICD-10-CM | POA: Diagnosis not present

## 2018-05-03 DIAGNOSIS — N3040 Irradiation cystitis without hematuria: Secondary | ICD-10-CM | POA: Diagnosis not present

## 2018-05-28 ENCOUNTER — Encounter: Payer: Self-pay | Admitting: Family Medicine

## 2018-05-28 ENCOUNTER — Ambulatory Visit: Payer: BLUE CROSS/BLUE SHIELD | Admitting: Family Medicine

## 2018-05-28 VITALS — BP 120/82 | HR 88 | Temp 98.1°F | Ht 64.0 in | Wt 226.4 lb

## 2018-05-28 DIAGNOSIS — G4733 Obstructive sleep apnea (adult) (pediatric): Secondary | ICD-10-CM

## 2018-05-28 DIAGNOSIS — I10 Essential (primary) hypertension: Secondary | ICD-10-CM

## 2018-05-28 DIAGNOSIS — R635 Abnormal weight gain: Secondary | ICD-10-CM | POA: Diagnosis not present

## 2018-05-28 DIAGNOSIS — K219 Gastro-esophageal reflux disease without esophagitis: Secondary | ICD-10-CM | POA: Diagnosis not present

## 2018-05-28 LAB — BASIC METABOLIC PANEL
BUN: 18 mg/dL (ref 6–23)
CALCIUM: 9.7 mg/dL (ref 8.4–10.5)
CHLORIDE: 101 meq/L (ref 96–112)
CO2: 28 meq/L (ref 19–32)
CREATININE: 0.94 mg/dL (ref 0.40–1.20)
GFR: 62.6 mL/min (ref >60.00)
Glucose, Bld: 89 mg/dL (ref 70–99)
Potassium: 4 mEq/L (ref 3.5–5.1)
SODIUM: 139 meq/L (ref 135–145)

## 2018-05-28 LAB — CBC WITH DIFFERENTIAL/PLATELET
BASOS PCT: 0.6 % (ref 0.0–3.0)
Basophils Absolute: 0.1 10*3/uL (ref 0.0–0.1)
EOS ABS: 0.4 10*3/uL (ref 0.0–0.7)
Eosinophils Relative: 3.5 % (ref 0.0–5.0)
HCT: 40.7 % (ref 36.0–46.0)
Hemoglobin: 13.7 g/dL (ref 12.0–15.0)
LYMPHS ABS: 4.6 10*3/uL — AB (ref 0.7–4.0)
Lymphocytes Relative: 35.7 % (ref 12.0–46.0)
MCHC: 33.6 g/dL (ref 30.0–36.0)
MCV: 84.7 fl (ref 78.0–100.0)
MONO ABS: 1 10*3/uL (ref 0.1–1.0)
Monocytes Relative: 7.5 % (ref 3.0–12.0)
NEUTROS ABS: 6.8 10*3/uL (ref 1.4–7.7)
Neutrophils Relative %: 52.7 % (ref 43.0–77.0)
PLATELETS: 463 10*3/uL — AB (ref 150.0–400.0)
RBC: 4.81 Mil/uL (ref 3.87–5.11)
RDW: 12.9 % (ref 11.5–15.5)
WBC: 12.8 10*3/uL — ABNORMAL HIGH (ref 4.0–10.5)

## 2018-05-28 LAB — HEPATIC FUNCTION PANEL
ALK PHOS: 90 U/L (ref 39–117)
ALT: 14 U/L (ref 0–35)
AST: 15 U/L (ref 0–37)
Albumin: 4.4 g/dL (ref 3.5–5.2)
BILIRUBIN DIRECT: 0.1 mg/dL (ref 0.0–0.3)
TOTAL PROTEIN: 6.9 g/dL (ref 6.0–8.3)
Total Bilirubin: 0.2 mg/dL (ref 0.2–1.2)

## 2018-05-28 LAB — TSH: TSH: 3.4 u[IU]/mL (ref 0.35–4.50)

## 2018-05-28 LAB — T4, FREE: FREE T4: 0.73 ng/dL (ref 0.60–1.60)

## 2018-05-28 LAB — T3, FREE: T3 FREE: 3.3 pg/mL (ref 2.3–4.2)

## 2018-05-28 MED ORDER — OMEPRAZOLE 40 MG PO CPDR
40.0000 mg | DELAYED_RELEASE_CAPSULE | Freq: Every day | ORAL | 5 refills | Status: DC
Start: 2018-05-28 — End: 2018-11-28

## 2018-05-28 NOTE — Progress Notes (Signed)
   Subjective:    Patient ID: Kari Lewis, female    DOB: 1965/10/25, 53 y.o.   MRN: [id number]  HPI Here for several issues. First for the past 6 months she has had frequent burning in the throat or in the chest, especially after eating. No SOB. No trouble swallowing. Pepcid AC helps. Also she expresses frustration over constant fatigue and inability to lose weight. She is on a Weight Watchers diet and she walks 3-4 days a week. She asks me to check her thyroid again. Of note she was proven to have sleep apnea and she has seen Dr. Betti Cruz for this. She tried wearing a full mask on a CPAP machine but she does not tolerate this.    Review of Systems  Constitutional: Positive for fatigue and unexpected weight change.  Respiratory: Negative.   Cardiovascular: Negative.   Gastrointestinal: Negative.   Neurological: Negative.        Objective:   Physical Exam Constitutional:      Comments: Morbidly obese   HENT:     Mouth/Throat:     Pharynx: No posterior oropharyngeal erythema.     Comments: She has a very narrow posterior OP with two large tonsils and a prominent uvula  Cardiovascular:     Rate and Rhythm: Normal rate and regular rhythm.     Pulses: Normal pulses.     Heart sounds: Normal heart sounds.  Pulmonary:     Effort: Pulmonary effort is normal.     Breath sounds: Normal breath sounds.  Abdominal:     General: Abdomen is flat. Bowel sounds are normal. There is no distension.     Palpations: Abdomen is soft. There is no mass.     Tenderness: There is no abdominal tenderness. There is no guarding or rebound.     Hernia: No hernia is present.  Neurological:     General: No focal deficit present.     Mental Status: Mental status is at baseline.           Assessment & Plan:  She has GERD so she will start on Omeprazole 40 mg daily. For the weight issues we will check labs including a full thyroid panel. I told her I think the main etiology of the fatigue and  weight problem is untreated  obstructive sleep apnea. I encouraged her to follow up with Dr. Betti Cruz to discuss alternative treatments including a UPPP surgery.  Alysia Penna, MD

## 2018-05-30 ENCOUNTER — Encounter (HOSPITAL_BASED_OUTPATIENT_CLINIC_OR_DEPARTMENT_OTHER): Payer: BLUE CROSS/BLUE SHIELD

## 2018-06-04 DIAGNOSIS — R350 Frequency of micturition: Secondary | ICD-10-CM | POA: Diagnosis not present

## 2018-06-04 DIAGNOSIS — R87615 Unsatisfactory cytologic smear of cervix: Secondary | ICD-10-CM | POA: Diagnosis not present

## 2018-07-05 ENCOUNTER — Other Ambulatory Visit: Payer: Self-pay | Admitting: Family Medicine

## 2018-07-23 ENCOUNTER — Telehealth: Payer: Self-pay | Admitting: Family Medicine

## 2018-07-23 NOTE — Telephone Encounter (Signed)
Patient called and stated that she was originally taking oxybutin 10 mg but her OBGYN sent in solifenacin 5 mg and it is not working. She would like to know if she can take one prescription of either or, or what she should do. Please advise [phone] (mobile)

## 2018-07-24 NOTE — Telephone Encounter (Signed)
They are both effective for overactive bladder, so she can take either one. But not both

## 2018-07-24 NOTE — Telephone Encounter (Signed)
Dr Fry please advise. thanks 

## 2018-08-04 ENCOUNTER — Other Ambulatory Visit: Payer: Self-pay | Admitting: Family Medicine

## 2018-08-18 ENCOUNTER — Other Ambulatory Visit: Payer: Self-pay | Admitting: Family Medicine

## 2018-08-20 DIAGNOSIS — R1900 Intra-abdominal and pelvic swelling, mass and lump, unspecified site: Secondary | ICD-10-CM | POA: Diagnosis not present

## 2018-08-20 DIAGNOSIS — N3281 Overactive bladder: Secondary | ICD-10-CM | POA: Diagnosis not present

## 2018-08-20 DIAGNOSIS — R102 Pelvic and perineal pain: Secondary | ICD-10-CM | POA: Diagnosis not present

## 2018-08-22 ENCOUNTER — Other Ambulatory Visit: Payer: Self-pay | Admitting: Family Medicine

## 2018-08-28 ENCOUNTER — Other Ambulatory Visit: Payer: Self-pay | Admitting: Family Medicine

## 2018-08-28 DIAGNOSIS — R102 Pelvic and perineal pain: Secondary | ICD-10-CM | POA: Diagnosis not present

## 2018-08-28 DIAGNOSIS — N3281 Overactive bladder: Secondary | ICD-10-CM | POA: Diagnosis not present

## 2018-08-28 DIAGNOSIS — R1900 Intra-abdominal and pelvic swelling, mass and lump, unspecified site: Secondary | ICD-10-CM | POA: Diagnosis not present

## 2018-09-10 DIAGNOSIS — N3941 Urge incontinence: Secondary | ICD-10-CM | POA: Diagnosis not present

## 2018-09-10 DIAGNOSIS — N393 Stress incontinence (female) (male): Secondary | ICD-10-CM | POA: Diagnosis not present

## 2018-09-10 DIAGNOSIS — M62838 Other muscle spasm: Secondary | ICD-10-CM | POA: Diagnosis not present

## 2018-09-10 DIAGNOSIS — M6281 Muscle weakness (generalized): Secondary | ICD-10-CM | POA: Diagnosis not present

## 2018-09-17 DIAGNOSIS — K5730 Diverticulosis of large intestine without perforation or abscess without bleeding: Secondary | ICD-10-CM | POA: Diagnosis not present

## 2018-09-17 DIAGNOSIS — Z1211 Encounter for screening for malignant neoplasm of colon: Secondary | ICD-10-CM | POA: Diagnosis not present

## 2018-09-17 DIAGNOSIS — K640 First degree hemorrhoids: Secondary | ICD-10-CM | POA: Diagnosis not present

## 2018-09-19 DIAGNOSIS — N3941 Urge incontinence: Secondary | ICD-10-CM | POA: Diagnosis not present

## 2018-09-19 DIAGNOSIS — M62838 Other muscle spasm: Secondary | ICD-10-CM | POA: Diagnosis not present

## 2018-09-19 DIAGNOSIS — N393 Stress incontinence (female) (male): Secondary | ICD-10-CM | POA: Diagnosis not present

## 2018-09-19 DIAGNOSIS — M6281 Muscle weakness (generalized): Secondary | ICD-10-CM | POA: Diagnosis not present

## 2018-10-03 ENCOUNTER — Other Ambulatory Visit: Payer: Self-pay | Admitting: *Deleted

## 2018-10-03 ENCOUNTER — Other Ambulatory Visit: Payer: Self-pay | Admitting: Family Medicine

## 2018-10-17 DIAGNOSIS — M6281 Muscle weakness (generalized): Secondary | ICD-10-CM | POA: Diagnosis not present

## 2018-10-17 DIAGNOSIS — N3941 Urge incontinence: Secondary | ICD-10-CM | POA: Diagnosis not present

## 2018-10-17 DIAGNOSIS — N393 Stress incontinence (female) (male): Secondary | ICD-10-CM | POA: Diagnosis not present

## 2018-10-17 DIAGNOSIS — M62838 Other muscle spasm: Secondary | ICD-10-CM | POA: Diagnosis not present

## 2018-10-23 DIAGNOSIS — N3941 Urge incontinence: Secondary | ICD-10-CM | POA: Diagnosis not present

## 2018-10-23 DIAGNOSIS — M62838 Other muscle spasm: Secondary | ICD-10-CM | POA: Diagnosis not present

## 2018-10-23 DIAGNOSIS — M6281 Muscle weakness (generalized): Secondary | ICD-10-CM | POA: Diagnosis not present

## 2018-10-23 DIAGNOSIS — N393 Stress incontinence (female) (male): Secondary | ICD-10-CM | POA: Diagnosis not present

## 2018-10-25 DIAGNOSIS — Z803 Family history of malignant neoplasm of breast: Secondary | ICD-10-CM | POA: Diagnosis not present

## 2018-10-25 DIAGNOSIS — Z1231 Encounter for screening mammogram for malignant neoplasm of breast: Secondary | ICD-10-CM | POA: Diagnosis not present

## 2018-10-25 LAB — HM MAMMOGRAPHY

## 2018-10-29 DIAGNOSIS — M6281 Muscle weakness (generalized): Secondary | ICD-10-CM | POA: Diagnosis not present

## 2018-10-29 DIAGNOSIS — N3941 Urge incontinence: Secondary | ICD-10-CM | POA: Diagnosis not present

## 2018-10-29 DIAGNOSIS — M62838 Other muscle spasm: Secondary | ICD-10-CM | POA: Diagnosis not present

## 2018-10-29 DIAGNOSIS — N393 Stress incontinence (female) (male): Secondary | ICD-10-CM | POA: Diagnosis not present

## 2018-10-30 ENCOUNTER — Encounter: Payer: Self-pay | Admitting: Family Medicine

## 2018-11-04 DIAGNOSIS — N393 Stress incontinence (female) (male): Secondary | ICD-10-CM | POA: Diagnosis not present

## 2018-11-04 DIAGNOSIS — M6281 Muscle weakness (generalized): Secondary | ICD-10-CM | POA: Diagnosis not present

## 2018-11-04 DIAGNOSIS — N3941 Urge incontinence: Secondary | ICD-10-CM | POA: Diagnosis not present

## 2018-11-04 DIAGNOSIS — M62838 Other muscle spasm: Secondary | ICD-10-CM | POA: Diagnosis not present

## 2018-11-25 DIAGNOSIS — N393 Stress incontinence (female) (male): Secondary | ICD-10-CM | POA: Diagnosis not present

## 2018-11-25 DIAGNOSIS — N3941 Urge incontinence: Secondary | ICD-10-CM | POA: Diagnosis not present

## 2018-11-25 DIAGNOSIS — M6281 Muscle weakness (generalized): Secondary | ICD-10-CM | POA: Diagnosis not present

## 2018-11-25 DIAGNOSIS — M62838 Other muscle spasm: Secondary | ICD-10-CM | POA: Diagnosis not present

## 2018-11-28 ENCOUNTER — Other Ambulatory Visit: Payer: Self-pay | Admitting: Family Medicine

## 2019-01-02 ENCOUNTER — Other Ambulatory Visit: Payer: Self-pay | Admitting: Family Medicine

## 2019-01-14 ENCOUNTER — Other Ambulatory Visit: Payer: Self-pay

## 2019-01-14 ENCOUNTER — Encounter: Payer: Self-pay | Admitting: Family Medicine

## 2019-01-14 ENCOUNTER — Ambulatory Visit: Payer: BC Managed Care – PPO | Admitting: Family Medicine

## 2019-01-14 VITALS — BP 108/70 | HR 99 | Temp 97.6°F | Ht 64.0 in | Wt 226.2 lb

## 2019-01-14 DIAGNOSIS — R1013 Epigastric pain: Secondary | ICD-10-CM

## 2019-01-14 NOTE — Progress Notes (Signed)
   Subjective:    Patient ID: Kari Lewis, female    DOB: [birth year], 53 y.o.   MRN: [id number]  HPI Here for epigastric pain that started 4 days ago. That evening she had a supper of [place] food and 20 minutes after eating she had the sudden onset of severe epigastric pain just below the sternum. This pain then began to radiate around the RUQ and into the right middle back. She was also very nauseated and she vomited twice. No fever. The pain lasted about 2 hours and then faded away. Since then she has had mild intermittent pains in these areas. Her appetite is normal but she is afraid to eat much for fear of the pain coming back. She has avoided fatty foods. Her BMs are regular. No urinary symptoms. She takes Omeprazole daily.    Review of Systems  Constitutional: Negative.   Respiratory: Negative.   Cardiovascular: Negative.   Gastrointestinal: Positive for abdominal distention, abdominal pain, nausea and vomiting. Negative for anal bleeding, blood in stool, constipation, diarrhea and rectal pain.  Genitourinary: Negative.        Objective:   Physical Exam Constitutional:      General: She is not in acute distress.    Appearance: Normal appearance. She is well-developed.  Cardiovascular:     Rate and Rhythm: Normal rate and regular rhythm.     Pulses: Normal pulses.     Heart sounds: Normal heart sounds.  Pulmonary:     Effort: Pulmonary effort is normal.     Breath sounds: Normal breath sounds.  Abdominal:     General: Abdomen is flat. Bowel sounds are normal. There is no distension.     Palpations: Abdomen is soft. There is no mass.     Tenderness: There is no guarding or rebound.     Hernia: No hernia is present.     Comments: Mildly tender in the epigastrium and RUQ   Neurological:     Mental Status: She is alert.           Assessment & Plan:  This is likely due to gall bladder disease. We will set up an abdominal US soon.  Alysia Penna, MD

## 2019-01-14 NOTE — Patient Instructions (Signed)
Health Maintenance Due  Topic Date Due  . HIV Screening  09/12/1981  . PAP SMEAR-Modifier  09/13/1987    Depression screen PHQ 2/9 05/29/2017  Decreased Interest 0  Down, Depressed, Hopeless 0  PHQ - 2 Score 0

## 2019-01-27 ENCOUNTER — Ambulatory Visit
Admission: RE | Admit: 2019-01-27 | Discharge: 2019-01-27 | Disposition: A | Discharge status: Home / Self Care | Payer: BC Managed Care – PPO | Source: Ambulatory Visit | Attending: Family Medicine | Admitting: Family Medicine

## 2019-01-27 DIAGNOSIS — K8020 Calculus of gallbladder without cholecystitis without obstruction: Secondary | ICD-10-CM | POA: Diagnosis not present

## 2019-01-27 DIAGNOSIS — R1013 Epigastric pain: Secondary | ICD-10-CM

## 2019-01-27 NOTE — Addendum Note (Signed)
Addended by: Alysia Penna A on: 01/27/2019 04:11 PM   Modules accepted: Orders

## 2019-01-31 ENCOUNTER — Other Ambulatory Visit: Payer: BC Managed Care – PPO

## 2019-02-03 ENCOUNTER — Telehealth: Payer: Self-pay

## 2019-02-03 DIAGNOSIS — R102 Pelvic and perineal pain: Secondary | ICD-10-CM | POA: Diagnosis not present

## 2019-02-03 DIAGNOSIS — N3281 Overactive bladder: Secondary | ICD-10-CM | POA: Diagnosis not present

## 2019-02-03 DIAGNOSIS — N94819 Vulvodynia, unspecified: Secondary | ICD-10-CM | POA: Diagnosis not present

## 2019-02-03 NOTE — Telephone Encounter (Signed)
Called pt back no answer. Called pt to go over lab results. Per Dr.Fry's annotation " Shows multiple gallstones as expected. I will refer her to Surgery"

## 2019-02-03 NOTE — Telephone Encounter (Signed)
Return call back to pt

## 2019-02-03 NOTE — Telephone Encounter (Signed)
Copied from [place] [id number]. Topic: General - Other >> Feb 03, 2019  8:02 AM Leward Quan A wrote: Reason for CRM: Patient called to request a call back with the results of the ultrasound that she recently had. She can be reached at Ph# [phone]

## 2019-02-04 NOTE — Telephone Encounter (Signed)
Pt returned Kendra's call/ please advise

## 2019-02-04 NOTE — Telephone Encounter (Signed)
Called pt again no answer. Detailed message left explaining [place]. Pt advised to call back only if she has questions.

## 2019-02-06 NOTE — Telephone Encounter (Signed)
Patient notified of update  and verbalized understanding. 

## 2019-02-13 ENCOUNTER — Ambulatory Visit: Payer: Self-pay | Admitting: Surgery

## 2019-02-13 DIAGNOSIS — K8050 Calculus of bile duct without cholangitis or cholecystitis without obstruction: Secondary | ICD-10-CM | POA: Diagnosis not present

## 2019-02-13 NOTE — H&P (Signed)
Surgical H&P   CC: abdominal pain  HPI: this is a very pleasant and otherwise healthy 53 year old woman referred for evaluation of symptomatic cholelithiasis.  She states that she had a colonoscopy in July and had significant nausea and emesis/by mouth intolerance for about 4 days after this.  Since that time she has had 2 or 3  Episodes of significant epigastric and right upper quadrant pain which radiates to the back and associated with nausea and emesis.  This follows meals but she has been essentially doing a low-fat diet and has not noticed worsening symptoms with fatty foods.  She has no prior abdominal surgeries.  She works as a Print production planner.  Allergies  Allergen Reactions  . Lisinopril     REACTION: cough    Past Medical History:  Diagnosis Date  . Allergy   . Anxiety   . Hypertension   . Leukocytosis    chronic & benign  . PPD positive, treated    with INH for 6 months    Past Surgical History:  Procedure Laterality Date  . BIOPSY BREAST     benign 1996    Family History  Problem Relation Age of Onset  . Breast cancer Other   . Coronary artery disease Other   . Diabetes Other   . Hyperlipidemia Other   . Hypertension Other   . Lung cancer Other   . Prostate cancer Other   . Stroke Other     Social History   Socioeconomic History  . Marital status: Married    Spouse name: Not on file  . Number of children: Not on file  . Years of education: Not on file  . Highest education level: Not on file  Occupational History  . Not on file  Social Needs  . Financial resource strain: Not on file  . Food insecurity    Worry: Not on file    Inability: Not on file  . Transportation needs    Medical: Not on file    Non-medical: Not on file  Tobacco Use  . Smoking status: Former Research scientist (life sciences)  . Smokeless tobacco: Never Used  Substance and Sexual Activity  . Alcohol use: No    Alcohol/week: 0.0 standard drinks  . Drug use: No  . Sexual activity: Not on file   Lifestyle  . Physical activity    Days per week: Not on file    Minutes per session: Not on file  . Stress: Not on file  Relationships  . Social Herbalist on phone: Not on file    Gets together: Not on file    Attends religious service: Not on file    Active member of club or organization: Not on file    Attends meetings of clubs or organizations: Not on file    Relationship status: Not on file  Other Topics Concern  . Not on file  Social History Narrative  . Not on file    Current Outpatient Medications on File Prior to Visit  Medication Sig Dispense Refill  . amLODipine (NORVASC) 10 MG tablet TAKE 1 TABLET BY MOUTH EVERY DAY 90 tablet 0  . escitalopram (LEXAPRO) 10 MG tablet TAKE 1 TABLET(10 MG) BY MOUTH DAILY 30 tablet 5  . hydrochlorothiazide (HYDRODIURIL) 25 MG tablet TAKE 1 TABLET BY MOUTH DAILY 90 tablet 3  . levonorgestrel (MIRENA) 20 MCG/24HR IUD 1 each by Intrauterine route once.    . Melatonin 5 MG SUBL Place 5 mg under the tongue.    Marland Kitchen  metoprolol succinate (TOPROL-XL) 50 MG 24 hr tablet TAKE 1 TABLET BY MOUTH EVERY DAY IMMEDIATELY FOLLOWING A MEAL 30 tablet 3  . Multiple Vitamin (MULTIVITAMIN) tablet Take 1 tablet by mouth daily.      Marland Kitchen omeprazole (PRILOSEC) 40 MG capsule TAKE 1 CAPSULE(40 MG) BY MOUTH DAILY 30 capsule 5  . potassium chloride (K-DUR) 10 MEQ tablet TAKE 1 TABLET BY MOUTH EVERY DAY 90 tablet 3  . venlafaxine XR (EFFEXOR-XR) 150 MG 24 hr capsule TAKE ONE CAPSULE BY MOUTH DAILY 90 capsule 0  . venlafaxine XR (EFFEXOR-XR) 150 MG 24 hr capsule TAKE ONE CAPSULE BY MOUTH DAILY (Patient not taking: Reported on 05/28/2018) 90 capsule 0  . venlafaxine XR (EFFEXOR-XR) 150 MG 24 hr capsule TAKE ONE CAPSULE BY MOUTH DAILY (Patient not taking: Reported on 05/28/2018) 90 capsule 3   No current facility-administered medications on file prior to visit.     Review of Systems: a complete, 10pt review of systems was completed with pertinent positives and  negatives as documented in the HPI  Physical Exam: Weight: 227.25 lb Height: 62in Body Surface Area: 2.02 m Body Mass Index: 41.56 kg/m  Temp.: 97.60F  Pulse: 95 (Regular)  P.OX: 98% (Room air) BP: 118/88 (Sitting, Left Arm, Standard)  She is alert and well-appearing. Extraocular motions intact, no scleral icterus. Respirations are even and unlabored. Neuro: grossly intact Psych: appropriate mood and affect, normal insight    CBC Latest Ref Rng & Units 05/28/2018 10/30/2017 02/05/2015  WBC 4.0 - 10.5 K/uL 12.8(H) 9.6 10.3  Hemoglobin 12.0 - 15.0 g/dL 13.7 13.8 14.8  Hematocrit 36.0 - 46.0 % 40.7 40.4 44.0  Platelets 150.0 - 400.0 K/uL 463.0(H) 470.0(H) 462.0(H)    CMP Latest Ref Rng & Units 05/28/2018 10/30/2017 02/05/2015  Glucose 70 - 99 mg/dL 89 155(H) 107(H)  BUN 6 - 23 mg/dL 18 14 16   Creatinine 0.40 - 1.20 mg/dL 0.94 0.70 0.69  Sodium 135 - 145 mEq/L 139 137 139  Potassium 3.5 - 5.1 mEq/L 4.0 4.1 3.8  Chloride 96 - 112 mEq/L 101 101 103  CO2 19 - 32 mEq/L 28 27 27   Calcium 8.4 - 10.5 mg/dL 9.7 9.8 9.4  Total Protein 6.0 - 8.3 g/dL 6.9 7.3 6.9  Total Bilirubin 0.2 - 1.2 mg/dL 0.2 0.3 0.3  Alkaline Phos 39 - 117 U/L 90 96 92  AST 0 - 37 U/L 15 16 17   ALT 0 - 35 U/L 14 16 17     No results found for: INR, PROTIME  Imaging: No results found.   A/P: BILIARY COLIC ([id number]) Story: I recommend proceeding with laparoscopic / robotic cholecystectomy with possible cholangiogram. Discussed risks of surgery including bleeding, pain, scarring, intraabdominal injury specifically to the common bile duct and sequelae, bile leak, conversion to open surgery, failure to resolve symptoms, blood clots/ pulmonary embolus, heart attack, pneumonia, stroke, death. Questions welcomed and answered to patient's satisfaction. She wishes to proceed with surgery and we will try to get her scheduled as soon as possible.  Patient Active Problem List   Diagnosis Date Noted  .  Gastroesophageal reflux disease 05/28/2018  . Sleep apnea, obstructive 10/30/2017  . OVERWEIGHT 05/26/2009  . ACUTE CYSTITIS 05/26/2009  . ACUTE BRONCHITIS 02/11/2009  . COUGH 02/11/2009  . ALLERGIC CONJUNCTIVITIS 12/22/2008  . TACHYCARDIA 08/18/2008  . ANXIETY 08/10/2008  . ACUTE SINUSITIS, UNSPECIFIED 05/29/2007  . LEUKOCYTOSIS 03/06/2007  . GENERALIZED ANXIETY DISORDER 03/06/2007  . Essential hypertension 10/23/2006  . ALLERGIC RHINITIS 10/23/2006  . TB SKIN  TEST, POSITIVE 10/23/2006  . UTI'S, HX OF 10/23/2006  . CHICKENPOX, HX OF 10/23/2006       Romana Juniper, MD [facility]  See AMION to contact on-call provider

## 2019-02-13 NOTE — H&P (View-Only) (Signed)
Surgical H&P   CC: abdominal pain  HPI: this is a very pleasant and otherwise healthy 53 year old woman referred for evaluation of symptomatic cholelithiasis.  She states that she had a colonoscopy in July and had significant nausea and emesis/by mouth intolerance for about 4 days after this.  Since that time she has had 2 or 3  Episodes of significant epigastric and right upper quadrant pain which radiates to the back and associated with nausea and emesis.  This follows meals but she has been essentially doing a low-fat diet and has not noticed worsening symptoms with fatty foods.  She has no prior abdominal surgeries.  She works as a Print production planner.  Allergies  Allergen Reactions  . Lisinopril     REACTION: cough    Past Medical History:  Diagnosis Date  . Allergy   . Anxiety   . Hypertension   . Leukocytosis    chronic & benign  . PPD positive, treated    with INH for 6 months    Past Surgical History:  Procedure Laterality Date  . BIOPSY BREAST     benign 1996    Family History  Problem Relation Age of Onset  . Breast cancer Other   . Coronary artery disease Other   . Diabetes Other   . Hyperlipidemia Other   . Hypertension Other   . Lung cancer Other   . Prostate cancer Other   . Stroke Other     Social History   Socioeconomic History  . Marital status: Married    Spouse name: Not on file  . Number of children: Not on file  . Years of education: Not on file  . Highest education level: Not on file  Occupational History  . Not on file  Social Needs  . Financial resource strain: Not on file  . Food insecurity    Worry: Not on file    Inability: Not on file  . Transportation needs    Medical: Not on file    Non-medical: Not on file  Tobacco Use  . Smoking status: Former Research scientist (life sciences)  . Smokeless tobacco: Never Used  Substance and Sexual Activity  . Alcohol use: No    Alcohol/week: 0.0 standard drinks  . Drug use: No  . Sexual activity: Not on file   Lifestyle  . Physical activity    Days per week: Not on file    Minutes per session: Not on file  . Stress: Not on file  Relationships  . Social Herbalist on phone: Not on file    Gets together: Not on file    Attends religious service: Not on file    Active member of club or organization: Not on file    Attends meetings of clubs or organizations: Not on file    Relationship status: Not on file  Other Topics Concern  . Not on file  Social History Narrative  . Not on file    Current Outpatient Medications on File Prior to Visit  Medication Sig Dispense Refill  . amLODipine (NORVASC) 10 MG tablet TAKE 1 TABLET BY MOUTH EVERY DAY 90 tablet 0  . escitalopram (LEXAPRO) 10 MG tablet TAKE 1 TABLET(10 MG) BY MOUTH DAILY 30 tablet 5  . hydrochlorothiazide (HYDRODIURIL) 25 MG tablet TAKE 1 TABLET BY MOUTH DAILY 90 tablet 3  . levonorgestrel (MIRENA) 20 MCG/24HR IUD 1 each by Intrauterine route once.    . Melatonin 5 MG SUBL Place 5 mg under the tongue.    Marland Kitchen  metoprolol succinate (TOPROL-XL) 50 MG 24 hr tablet TAKE 1 TABLET BY MOUTH EVERY DAY IMMEDIATELY FOLLOWING A MEAL 30 tablet 3  . Multiple Vitamin (MULTIVITAMIN) tablet Take 1 tablet by mouth daily.      Marland Kitchen omeprazole (PRILOSEC) 40 MG capsule TAKE 1 CAPSULE(40 MG) BY MOUTH DAILY 30 capsule 5  . potassium chloride (K-DUR) 10 MEQ tablet TAKE 1 TABLET BY MOUTH EVERY DAY 90 tablet 3  . venlafaxine XR (EFFEXOR-XR) 150 MG 24 hr capsule TAKE ONE CAPSULE BY MOUTH DAILY 90 capsule 0  . venlafaxine XR (EFFEXOR-XR) 150 MG 24 hr capsule TAKE ONE CAPSULE BY MOUTH DAILY (Patient not taking: Reported on 05/28/2018) 90 capsule 0  . venlafaxine XR (EFFEXOR-XR) 150 MG 24 hr capsule TAKE ONE CAPSULE BY MOUTH DAILY (Patient not taking: Reported on 05/28/2018) 90 capsule 3   No current facility-administered medications on file prior to visit.     Review of Systems: a complete, 10pt review of systems was completed with pertinent positives and  negatives as documented in the HPI  Physical Exam: Weight: 227.25 lb Height: 62in Body Surface Area: 2.02 m Body Mass Index: 41.56 kg/m  Temp.: 97.28F  Pulse: 95 (Regular)  P.OX: 98% (Room air) BP: 118/88 (Sitting, Left Arm, Standard)  She is alert and well-appearing. Extraocular motions intact, no scleral icterus. Respirations are even and unlabored. Neuro: grossly intact Psych: appropriate mood and affect, normal insight    CBC Latest Ref Rng & Units 05/28/2018 10/30/2017 02/05/2015  WBC 4.0 - 10.5 K/uL 12.8(H) 9.6 10.3  Hemoglobin 12.0 - 15.0 g/dL 13.7 13.8 14.8  Hematocrit 36.0 - 46.0 % 40.7 40.4 44.0  Platelets 150.0 - 400.0 K/uL 463.0(H) 470.0(H) 462.0(H)    CMP Latest Ref Rng & Units 05/28/2018 10/30/2017 02/05/2015  Glucose 70 - 99 mg/dL 89 155(H) 107(H)  BUN 6 - 23 mg/dL 18 14 16   Creatinine 0.40 - 1.20 mg/dL 0.94 0.70 0.69  Sodium 135 - 145 mEq/L 139 137 139  Potassium 3.5 - 5.1 mEq/L 4.0 4.1 3.8  Chloride 96 - 112 mEq/L 101 101 103  CO2 19 - 32 mEq/L 28 27 27   Calcium 8.4 - 10.5 mg/dL 9.7 9.8 9.4  Total Protein 6.0 - 8.3 g/dL 6.9 7.3 6.9  Total Bilirubin 0.2 - 1.2 mg/dL 0.2 0.3 0.3  Alkaline Phos 39 - 117 U/L 90 96 92  AST 0 - 37 U/L 15 16 17   ALT 0 - 35 U/L 14 16 17     No results found for: INR, PROTIME  Imaging: No results found.   A/P: BILIARY COLIC ([id number]) Story: I recommend proceeding with laparoscopic / robotic cholecystectomy with possible cholangiogram. Discussed risks of surgery including bleeding, pain, scarring, intraabdominal injury specifically to the common bile duct and sequelae, bile leak, conversion to open surgery, failure to resolve symptoms, blood clots/ pulmonary embolus, heart attack, pneumonia, stroke, death. Questions welcomed and answered to patient's satisfaction. She wishes to proceed with surgery and we will try to get her scheduled as soon as possible.  Patient Active Problem List   Diagnosis Date Noted  .  Gastroesophageal reflux disease 05/28/2018  . Sleep apnea, obstructive 10/30/2017  . OVERWEIGHT 05/26/2009  . ACUTE CYSTITIS 05/26/2009  . ACUTE BRONCHITIS 02/11/2009  . COUGH 02/11/2009  . ALLERGIC CONJUNCTIVITIS 12/22/2008  . TACHYCARDIA 08/18/2008  . ANXIETY 08/10/2008  . ACUTE SINUSITIS, UNSPECIFIED 05/29/2007  . LEUKOCYTOSIS 03/06/2007  . GENERALIZED ANXIETY DISORDER 03/06/2007  . Essential hypertension 10/23/2006  . ALLERGIC RHINITIS 10/23/2006  . TB SKIN  TEST, POSITIVE 10/23/2006  . UTI'S, HX OF 10/23/2006  . CHICKENPOX, HX OF 10/23/2006       Romana Juniper, MD [facility]  See AMION to contact on-call provider

## 2019-02-19 NOTE — Progress Notes (Signed)
[facility] [place], [place] - [address] [place] [address] [place] [address] Phone: [phone] Fax: [phone]      Your procedure is scheduled on Monday, February 24, 2019.   Report to [facility] Main Entrance "A" at 7:30 A.M., and check in at the Admitting office.   Call this number if you have problems the morning of surgery:  [phone]  Call [phone] if you have any questions prior to your surgery date Monday-Friday 8am-4pm    Remember:  Do not eat or drink after midnight the night before your surgery    Take these medicines the morning of surgery with A SIP OF WATER :  Amlodipine (Norvasc)  7 days prior to surgery STOP taking any Aspirin (unless otherwise instructed by your surgeon), Aleve, Naproxen, Ibuprofen, Motrin, Advil, Goody's, BC's, all herbal medications, fish oil, and all vitamins.    The Morning of Surgery  Do not wear jewelry, make-up or nail polish.  Do not wear lotions, powders, or perfumes/colognes, or deodorant  Do not shave 48 hours prior to surgery.  Men may shave face and neck.  Do not bring valuables to the hospital.  [facility] is not responsible for any belongings or valuables.  If you are a smoker, DO NOT Smoke 24 hours prior to surgery  If you wear a CPAP at night please bring your mask, tubing, and machine the morning of surgery   Remember that you must have someone to transport you home after your surgery, and remain with you for 24 hours if you are discharged the same day.   Please bring cases for contacts, glasses, hearing aids, dentures or bridgework because it cannot be worn into surgery.    Leave your suitcase in the car.  After surgery it may be brought to your room.  For patients admitted to the hospital, discharge time will be determined by your treatment team.  Patients discharged the day of surgery will not be allowed to drive home.    Special  instructions:   [facility]- Preparing For Surgery  Before surgery, you can play an important role. Because skin is not sterile, your skin needs to be as free of germs as possible. You can reduce the number of germs on your skin by washing with CHG (chlorahexidine gluconate) Soap before surgery.  CHG is an antiseptic cleaner which kills germs and bonds with the skin to continue killing germs even after washing.    Oral Hygiene is also important to reduce your risk of infection.  Remember - BRUSH YOUR TEETH THE MORNING OF SURGERY WITH YOUR REGULAR TOOTHPASTE  Please do not use if you have an allergy to CHG or antibacterial soaps. If your skin becomes reddened/irritated stop using the CHG.  Do not shave (including legs and underarms) for at least 48 hours prior to first CHG shower. It is OK to shave your face.  Please follow these instructions carefully.   1. Shower the NIGHT BEFORE SURGERY and the MORNING OF SURGERY with CHG Soap.   2. If you chose to wash your hair, wash your hair first as usual with your normal shampoo.  3. After you shampoo, rinse your hair and body thoroughly to remove the shampoo.  4. Use CHG as you would any other liquid soap. You can apply CHG directly to the skin and wash gently with a scrungie or a clean washcloth.   5.  Apply the CHG Soap to your body ONLY FROM THE NECK DOWN.  Do not use on open wounds or open sores. Avoid contact with your eyes, ears, mouth and genitals (private parts). Wash Face and genitals (private parts)  with your normal soap.   6. Wash thoroughly, paying special attention to the area where your surgery will be performed.  7. Thoroughly rinse your body with warm water from the neck down.  8. DO NOT shower/wash with your normal soap after using and rinsing off the CHG Soap.  9. Pat yourself dry with a CLEAN TOWEL.  10. Wear CLEAN PAJAMAS to bed the night before surgery, wear comfortable clothes the morning of surgery  11. Place CLEAN  SHEETS on your bed the night of your first shower and DO NOT SLEEP WITH PETS.   Day of Surgery:  Please shower the morning of surgery with the CHG soap Do not apply any deodorants/lotions. Please wear clean clothes to the hospital/surgery center.   Remember to brush your teeth WITH YOUR REGULAR TOOTHPASTE.   Please read over the following fact sheets that you were given.

## 2019-02-20 ENCOUNTER — Encounter (HOSPITAL_COMMUNITY): Payer: Self-pay

## 2019-02-20 ENCOUNTER — Other Ambulatory Visit (HOSPITAL_COMMUNITY): Payer: BC Managed Care – PPO

## 2019-02-20 ENCOUNTER — Other Ambulatory Visit (HOSPITAL_COMMUNITY)
Admission: RE | Admit: 2019-02-20 | Discharge: 2019-02-20 | Disposition: A | Discharge status: Home / Self Care | Payer: BC Managed Care – PPO | Source: Ambulatory Visit | Attending: Surgery | Admitting: Surgery

## 2019-02-20 ENCOUNTER — Encounter (HOSPITAL_COMMUNITY)
Admission: RE | Admit: 2019-02-20 | Discharge: 2019-02-20 | Disposition: A | Discharge status: Home / Self Care | Payer: BC Managed Care – PPO | Source: Ambulatory Visit | Attending: Surgery | Admitting: Surgery

## 2019-02-20 ENCOUNTER — Other Ambulatory Visit: Payer: Self-pay

## 2019-02-20 DIAGNOSIS — Z01818 Encounter for other preprocedural examination: Secondary | ICD-10-CM | POA: Insufficient documentation

## 2019-02-20 HISTORY — DX: Other complications of anesthesia, initial encounter: T88.59XA

## 2019-02-20 HISTORY — DX: Pneumonia, unspecified organism: J18.9

## 2019-02-20 HISTORY — DX: Other specified postprocedural states: Z98.890

## 2019-02-20 HISTORY — DX: Nausea with vomiting, unspecified: R11.2

## 2019-02-20 LAB — COMPREHENSIVE METABOLIC PANEL
ALT: 19 U/L (ref 0–44)
AST: 19 U/L (ref 15–41)
Albumin: 4 g/dL (ref 3.5–5.0)
Alkaline Phosphatase: 76 U/L (ref 38–126)
Anion gap: 11 (ref 5–15)
BUN: 15 mg/dL (ref 6–20)
CO2: 23 mmol/L (ref 22–32)
Calcium: 8.9 mg/dL (ref 8.9–10.3)
Chloride: 102 mmol/L (ref 98–111)
Creatinine, Ser: 0.96 mg/dL (ref 0.44–1.00)
GFR calc Af Amer: >60 mL/min (ref >60)
GFR calc non Af Amer: >60 mL/min (ref >60)
Glucose, Bld: 179 mg/dL — ABNORMAL HIGH (ref 70–99)
Potassium: 3.7 mmol/L (ref 3.5–5.1)
Sodium: 136 mmol/L (ref 135–145)
Total Bilirubin: 0.6 mg/dL (ref 0.3–1.2)
Total Protein: 7 g/dL (ref 6.5–8.1)

## 2019-02-20 LAB — CBC WITH DIFFERENTIAL/PLATELET
Abs Immature Granulocytes: 0.02 10*3/uL (ref 0.00–0.07)
Basophils Absolute: 0.1 10*3/uL (ref 0.0–0.1)
Basophils Relative: 1 %
Eosinophils Absolute: 0.4 10*3/uL (ref 0.0–0.5)
Eosinophils Relative: 4 %
HCT: 42.5 % (ref 36.0–46.0)
Hemoglobin: 13.6 g/dL (ref 12.0–15.0)
Immature Granulocytes: 0 %
Lymphocytes Relative: 45 %
Lymphs Abs: 4.4 10*3/uL — ABNORMAL HIGH (ref 0.7–4.0)
MCH: 28 pg (ref 26.0–34.0)
MCHC: 32 g/dL (ref 30.0–36.0)
MCV: 87.4 fL (ref 80.0–100.0)
Monocytes Absolute: 0.6 10*3/uL (ref 0.1–1.0)
Monocytes Relative: 6 %
Neutro Abs: 4.5 10*3/uL (ref 1.7–7.7)
Neutrophils Relative %: 44 %
Platelets: 462 10*3/uL — ABNORMAL HIGH (ref 150–400)
RBC: 4.86 MIL/uL (ref 3.87–5.11)
RDW: 12.5 % (ref 11.5–15.5)
WBC: 10.1 10*3/uL (ref 4.0–10.5)
nRBC: 0 % (ref 0.0–0.2)

## 2019-02-20 NOTE — Progress Notes (Addendum)
Covid test scheduled for today. No h/o symptoms concerning for Covid. No h/o recent travel. No h/o exposure to Covid positive patients.  PCP - Dr Alysia Penna  Cardiologist - denies  Chest x-ray - denies  EKG - today  Stress Test - denies  ECHO - denies  Cardiac Cath - denies  AICD-denies PM-denies LOOP-denies  Sleep Study - Y CPAP - No  LABS-CBC diff, CMP   ERAS-NA  HA1C-denies Fasting Blood Sugar -  Checks Blood Sugar _____ times a day  Anesthesia-N  Pt denies having chest pain, sob, or fever at this time. All instructions explained to the pt, with a verbal understanding of the material. Pt agrees to go over the instructions while at home for a better understanding. Pt also instructed to self quarantine after being tested for COVID-19. The opportunity to ask questions was provided.

## 2019-02-21 LAB — NOVEL CORONAVIRUS, NAA (HOSP ORDER, SEND-OUT TO REF LAB; TAT 18-24 HRS): SARS-CoV-2, NAA: NOT DETECTED

## 2019-02-21 MED ORDER — BUPIVACAINE LIPOSOME 1.3 % IJ SUSP
20.0000 mL | INTRAMUSCULAR | Status: AC
  Administered 2019-02-24: 20 mL
  Filled 2019-02-21: qty 20

## 2019-02-24 ENCOUNTER — Ambulatory Visit (HOSPITAL_COMMUNITY): Payer: BC Managed Care – PPO | Admitting: Anesthesiology

## 2019-02-24 ENCOUNTER — Encounter (HOSPITAL_COMMUNITY): Payer: Self-pay

## 2019-02-24 ENCOUNTER — Encounter (HOSPITAL_COMMUNITY)
Admission: RE | Disposition: A | Discharge status: Home / Self Care | Payer: Self-pay | Source: Home / Self Care | Attending: Surgery

## 2019-02-24 ENCOUNTER — Other Ambulatory Visit: Payer: Self-pay

## 2019-02-24 ENCOUNTER — Ambulatory Visit (HOSPITAL_COMMUNITY)
Admission: RE | Admit: 2019-02-24 | Discharge: 2019-02-24 | Disposition: A | Discharge status: Home / Self Care | Payer: BC Managed Care – PPO | Attending: Surgery | Admitting: Surgery

## 2019-02-24 DIAGNOSIS — K8050 Calculus of bile duct without cholangitis or cholecystitis without obstruction: Secondary | ICD-10-CM | POA: Diagnosis not present

## 2019-02-24 DIAGNOSIS — F419 Anxiety disorder, unspecified: Secondary | ICD-10-CM | POA: Diagnosis not present

## 2019-02-24 DIAGNOSIS — Z6841 Body Mass Index (BMI) 40.0 and over, adult: Secondary | ICD-10-CM | POA: Insufficient documentation

## 2019-02-24 DIAGNOSIS — Z79899 Other long term (current) drug therapy: Secondary | ICD-10-CM | POA: Diagnosis not present

## 2019-02-24 DIAGNOSIS — Z87891 Personal history of nicotine dependence: Secondary | ICD-10-CM | POA: Diagnosis not present

## 2019-02-24 DIAGNOSIS — K8010 Calculus of gallbladder with chronic cholecystitis without obstruction: Secondary | ICD-10-CM | POA: Diagnosis not present

## 2019-02-24 DIAGNOSIS — R160 Hepatomegaly, not elsewhere classified: Secondary | ICD-10-CM | POA: Diagnosis not present

## 2019-02-24 DIAGNOSIS — K660 Peritoneal adhesions (postprocedural) (postinfection): Secondary | ICD-10-CM | POA: Insufficient documentation

## 2019-02-24 DIAGNOSIS — G4733 Obstructive sleep apnea (adult) (pediatric): Secondary | ICD-10-CM | POA: Insufficient documentation

## 2019-02-24 DIAGNOSIS — K219 Gastro-esophageal reflux disease without esophagitis: Secondary | ICD-10-CM | POA: Diagnosis not present

## 2019-02-24 DIAGNOSIS — K811 Chronic cholecystitis: Secondary | ICD-10-CM | POA: Diagnosis not present

## 2019-02-24 DIAGNOSIS — I10 Essential (primary) hypertension: Secondary | ICD-10-CM | POA: Diagnosis not present

## 2019-02-24 HISTORY — PX: CHOLECYSTECTOMY: SHX55

## 2019-02-24 LAB — POCT PREGNANCY, URINE: Preg Test, Ur: NEGATIVE

## 2019-02-24 SURGERY — LAPAROSCOPIC CHOLECYSTECTOMY
Anesthesia: General

## 2019-02-24 MED ORDER — LACTATED RINGERS IV SOLN
INTRAVENOUS | Status: DC
  Administered 2019-02-24: 08:00:00 via INTRAVENOUS

## 2019-02-24 MED ORDER — FENTANYL CITRATE (PF) 100 MCG/2ML IJ SOLN
25.0000 ug | INTRAMUSCULAR | Status: DC | PRN
  Administered 2019-02-24: 11:00:00 25 ug via INTRAVENOUS

## 2019-02-24 MED ORDER — SODIUM CHLORIDE 0.9 % IR SOLN
Status: DC | PRN
  Administered 2019-02-24: 1000 mL

## 2019-02-24 MED ORDER — GABAPENTIN 300 MG PO CAPS
300.0000 mg | ORAL_CAPSULE | ORAL | Status: AC
  Administered 2019-02-24: 08:00:00 300 mg via ORAL

## 2019-02-24 MED ORDER — ONDANSETRON HCL 4 MG/2ML IJ SOLN
INTRAMUSCULAR | Status: DC | PRN
  Administered 2019-02-24: 4 mg via INTRAVENOUS

## 2019-02-24 MED ORDER — OXYCODONE HCL 5 MG/5ML PO SOLN: 5.0000 mg | Freq: Once | ORAL | Status: AC | PRN

## 2019-02-24 MED ORDER — OXYCODONE HCL 5 MG PO TABS
5.0000 mg | ORAL_TABLET | Freq: Once | ORAL | Status: AC | PRN
  Administered 2019-02-24: 11:00:00 5 mg via ORAL

## 2019-02-24 MED ORDER — CHLORHEXIDINE GLUCONATE 4 % EX LIQD: 60.0000 mL | Freq: Once | CUTANEOUS | Status: DC

## 2019-02-24 MED ORDER — CEFAZOLIN SODIUM-DEXTROSE 2-4 GM/100ML-% IV SOLN
2.0000 g | INTRAVENOUS | Status: AC
  Administered 2019-02-24: 2 g via INTRAVENOUS
  Filled 2019-02-24: qty 100

## 2019-02-24 MED ORDER — TRAMADOL HCL 50 MG PO TABS: 50.0000 mg | ORAL_TABLET | Freq: Four times a day (QID) | ORAL | 0 refills | Status: DC | PRN

## 2019-02-24 MED ORDER — HEMOSTATIC AGENTS (NO CHARGE) OPTIME
TOPICAL | Status: DC | PRN
  Administered 2019-02-24: 1 via TOPICAL

## 2019-02-24 MED ORDER — BUPIVACAINE HCL (PF) 0.25 % IJ SOLN
INTRAMUSCULAR | Status: AC
  Filled 2019-02-24: qty 30

## 2019-02-24 MED ORDER — ACETAMINOPHEN 325 MG PO TABS: 650.0000 mg | ORAL_TABLET | ORAL | Status: DC | PRN

## 2019-02-24 MED ORDER — PHENYLEPHRINE HCL (PRESSORS) 10 MG/ML IV SOLN
INTRAVENOUS | Status: DC | PRN
  Administered 2019-02-24: 80 ug via INTRAVENOUS

## 2019-02-24 MED ORDER — DOCUSATE SODIUM 100 MG PO CAPS: 100.0000 mg | ORAL_CAPSULE | Freq: Two times a day (BID) | ORAL | 0 refills | Status: AC

## 2019-02-24 MED ORDER — ROCURONIUM BROMIDE 10 MG/ML (PF) SYRINGE
PREFILLED_SYRINGE | INTRAVENOUS | Status: DC | PRN
  Administered 2019-02-24: 50 mg via INTRAVENOUS

## 2019-02-24 MED ORDER — FENTANYL CITRATE (PF) 100 MCG/2ML IJ SOLN: 25.0000 ug | INTRAMUSCULAR | Status: DC | PRN

## 2019-02-24 MED ORDER — SODIUM CHLORIDE 0.9% FLUSH: 3.0000 mL | Freq: Two times a day (BID) | INTRAVENOUS | Status: DC

## 2019-02-24 MED ORDER — OXYCODONE HCL 5 MG PO TABS
ORAL_TABLET | ORAL | Status: AC
  Filled 2019-02-24: qty 1

## 2019-02-24 MED ORDER — ACETAMINOPHEN 650 MG RE SUPP: 650.0000 mg | RECTAL | Status: DC | PRN

## 2019-02-24 MED ORDER — SODIUM CHLORIDE 0.9% FLUSH: 3.0000 mL | INTRAVENOUS | Status: DC | PRN

## 2019-02-24 MED ORDER — OXYCODONE HCL 5 MG PO TABS: 5.0000 mg | ORAL_TABLET | ORAL | Status: DC | PRN

## 2019-02-24 MED ORDER — PROPOFOL 10 MG/ML IV BOLUS
INTRAVENOUS | Status: DC | PRN
  Administered 2019-02-24: 160 mg via INTRAVENOUS

## 2019-02-24 MED ORDER — FENTANYL CITRATE (PF) 250 MCG/5ML IJ SOLN
INTRAMUSCULAR | Status: AC
  Filled 2019-02-24: qty 5

## 2019-02-24 MED ORDER — MIDAZOLAM HCL 2 MG/2ML IJ SOLN
INTRAMUSCULAR | Status: AC
  Filled 2019-02-24: qty 2

## 2019-02-24 MED ORDER — LIDOCAINE 2% (20 MG/ML) 5 ML SYRINGE
INTRAMUSCULAR | Status: DC | PRN
  Administered 2019-02-24: 60 mg via INTRAVENOUS

## 2019-02-24 MED ORDER — PROMETHAZINE HCL 25 MG/ML IJ SOLN: 6.2500 mg | INTRAMUSCULAR | Status: DC | PRN

## 2019-02-24 MED ORDER — ACETAMINOPHEN 500 MG PO TABS
1000.0000 mg | ORAL_TABLET | ORAL | Status: AC
  Administered 2019-02-24: 1000 mg via ORAL
  Filled 2019-02-24: qty 2

## 2019-02-24 MED ORDER — FENTANYL CITRATE (PF) 100 MCG/2ML IJ SOLN
INTRAMUSCULAR | Status: AC
  Filled 2019-02-24: qty 2

## 2019-02-24 MED ORDER — MIDAZOLAM HCL 2 MG/2ML IJ SOLN
INTRAMUSCULAR | Status: DC | PRN
  Administered 2019-02-24: 2 mg via INTRAVENOUS

## 2019-02-24 MED ORDER — DEXAMETHASONE SODIUM PHOSPHATE 10 MG/ML IJ SOLN
INTRAMUSCULAR | Status: DC | PRN
  Administered 2019-02-24: 10 mg via INTRAVENOUS

## 2019-02-24 MED ORDER — BUPIVACAINE HCL 0.25 % IJ SOLN
INTRAMUSCULAR | Status: DC | PRN
  Administered 2019-02-24: 30 mL

## 2019-02-24 MED ORDER — PHENYLEPHRINE HCL-NACL 10-0.9 MG/250ML-% IV SOLN
INTRAVENOUS | Status: DC | PRN
  Administered 2019-02-24: 15 ug/min via INTRAVENOUS

## 2019-02-24 MED ORDER — INDOCYANINE GREEN 25 MG IV SOLR: 2.5000 mg | Freq: Once | INTRAVENOUS | Status: DC

## 2019-02-24 MED ORDER — FENTANYL CITRATE (PF) 250 MCG/5ML IJ SOLN
INTRAMUSCULAR | Status: DC | PRN
  Administered 2019-02-24 (×4): 50 ug via INTRAVENOUS

## 2019-02-24 MED ORDER — 0.9 % SODIUM CHLORIDE (POUR BTL) OPTIME
TOPICAL | Status: DC | PRN
  Administered 2019-02-24: 10:00:00 1000 mL

## 2019-02-24 MED ORDER — PROPOFOL 10 MG/ML IV BOLUS
INTRAVENOUS | Status: AC
  Filled 2019-02-24: qty 20

## 2019-02-24 MED ORDER — SUGAMMADEX SODIUM 200 MG/2ML IV SOLN
INTRAVENOUS | Status: DC | PRN
  Administered 2019-02-24: 200 mg via INTRAVENOUS

## 2019-02-24 MED ORDER — SODIUM CHLORIDE 0.9 % IV SOLN: 250.0000 mL | INTRAVENOUS | Status: DC | PRN

## 2019-02-24 MED ORDER — SCOPOLAMINE 1 MG/3DAYS TD PT72
MEDICATED_PATCH | TRANSDERMAL | Status: DC | PRN
  Administered 2019-02-24: 1 via TRANSDERMAL

## 2019-02-24 SURGICAL SUPPLY — 35 items
APPLIER CLIP 5 13 M/L LIGAMAX5 (MISCELLANEOUS) ×3
BLADE CLIPPER SURG (BLADE) IMPLANT
CANISTER SUCT 3000ML PPV (MISCELLANEOUS) ×3 IMPLANT
CHLORAPREP W/TINT 26 (MISCELLANEOUS) ×3 IMPLANT
CLIP APPLIE 5 13 M/L LIGAMAX5 (MISCELLANEOUS) ×1 IMPLANT
COVER SURGICAL LIGHT HANDLE (MISCELLANEOUS) ×3 IMPLANT
COVER WAND RF STERILE (DRAPES) ×3 IMPLANT
DERMABOND ADVANCED (GAUZE/BANDAGES/DRESSINGS) ×2
DERMABOND ADVANCED .7 DNX12 (GAUZE/BANDAGES/DRESSINGS) ×1 IMPLANT
ELECT REM PT RETURN 9FT ADLT (ELECTROSURGICAL) ×3
ELECTRODE REM PT RTRN 9FT ADLT (ELECTROSURGICAL) ×1 IMPLANT
GLOVE BIO SURGEON STRL SZ 6 (GLOVE) ×3 IMPLANT
GLOVE INDICATOR 6.5 STRL GRN (GLOVE) ×3 IMPLANT
GOWN STRL REUS W/ TWL LRG LVL3 (GOWN DISPOSABLE) ×3 IMPLANT
GOWN STRL REUS W/TWL LRG LVL3 (GOWN DISPOSABLE) ×6
GRASPER SUT TROCAR 14GX15 (MISCELLANEOUS) ×3 IMPLANT
KIT BASIN OR (CUSTOM PROCEDURE TRAY) ×3 IMPLANT
KIT TURNOVER KIT B (KITS) ×3 IMPLANT
NDL INSUFFLATION 14GA 120MM (NEEDLE) ×1 IMPLANT
NEEDLE INSUFFLATION 14GA 120MM (NEEDLE) ×3 IMPLANT
NS IRRIG 1000ML POUR BTL (IV SOLUTION) ×3 IMPLANT
PAD ARMBOARD 7.5X6 YLW CONV (MISCELLANEOUS) ×3 IMPLANT
POUCH SPECIMEN RETRIEVAL 10MM (ENDOMECHANICALS) ×3 IMPLANT
SCISSORS LAP 5X35 DISP (ENDOMECHANICALS) ×3 IMPLANT
SET IRRIG TUBING LAPAROSCOPIC (IRRIGATION / IRRIGATOR) ×3 IMPLANT
SET TUBE SMOKE EVAC HIGH FLOW (TUBING) ×3 IMPLANT
SLEEVE ENDOPATH XCEL 5M (ENDOMECHANICALS) ×6 IMPLANT
SPECIMEN JAR SMALL (MISCELLANEOUS) ×3 IMPLANT
SUT MNCRL AB 4-0 PS2 18 (SUTURE) ×3 IMPLANT
TOWEL GREEN STERILE (TOWEL DISPOSABLE) IMPLANT
TOWEL GREEN STERILE FF (TOWEL DISPOSABLE) ×3 IMPLANT
TRAY LAPAROSCOPIC MC (CUSTOM PROCEDURE TRAY) ×3 IMPLANT
TROCAR XCEL NON-BLD 11X100MML (ENDOMECHANICALS) ×3 IMPLANT
TROCAR XCEL NON-BLD 5MMX100MML (ENDOMECHANICALS) ×3 IMPLANT
WATER STERILE IRR 1000ML POUR (IV SOLUTION) ×3 IMPLANT

## 2019-02-24 NOTE — Transfer of Care (Signed)
Immediate Anesthesia Transfer of Care Note  Patient: Kari Lewis  Procedure(s) Performed: LAPAROSCOPIC CHOLECYSTECTOMY (N/A )  Patient Location: PACU  Anesthesia Type:General  Level of Consciousness: awake, alert  and oriented  Airway & Oxygen Therapy: Patient Spontanous Breathing and Patient connected to nasal cannula oxygen  Post-op Assessment: Report given to RN and Post -op Vital signs reviewed and stable  Post vital signs: Reviewed and stable  Last Vitals:  Vitals Value Taken Time  BP 136/84 02/24/19 1034  Temp    Pulse 90 02/24/19 1036  Resp 11 02/24/19 1036  SpO2 91 % 02/24/19 1036  Vitals shown include unvalidated device data.  Last Pain:  Vitals:   02/24/19 0802  TempSrc:   PainSc: 0-No pain         Complications: No apparent anesthesia complications

## 2019-02-24 NOTE — Anesthesia Preprocedure Evaluation (Addendum)
Anesthesia Evaluation  Patient identified by MRN, date of birth, ID band Patient awake    Reviewed: Allergy & Precautions, NPO status , Patient's Chart, lab work & pertinent test results  History of Anesthesia Complications (+) PONV and history of anesthetic complications  Airway Mallampati: II  TM Distance: >3 FB Neck ROM: Full    Dental  (+) Dental Advisory Given, Chipped,    Pulmonary sleep apnea , former smoker,   Hx +PPD s/p 6 mo of INH    Pulmonary exam normal        Cardiovascular hypertension, Pt. on medications and Pt. on home beta blockers Normal cardiovascular exam     Neuro/Psych PSYCHIATRIC DISORDERS Anxiety negative neurological ROS     GI/Hepatic Neg liver ROS, GERD  Medicated and Controlled,  Endo/Other  Morbid obesity  Renal/GU negative Renal ROS     Musculoskeletal negative musculoskeletal ROS (+)   Abdominal (+) + obese,   Peds  Hematology negative hematology ROS (+)   Anesthesia Other Findings   Reproductive/Obstetrics                            Anesthesia Physical Anesthesia Plan  ASA: III  Anesthesia Plan: General   Post-op Pain Management:    Induction: Intravenous  PONV Risk Score and Plan: 4 or greater and Treatment may vary due to age or medical condition, Ondansetron, Scopolamine patch - Pre-op, Midazolam and Dexamethasone  Airway Management Planned: Oral ETT  Additional Equipment: None  Intra-op Plan:   Post-operative Plan: Extubation in OR  Informed Consent: I have reviewed the patients History and Physical, chart, labs and discussed the procedure including the risks, benefits and alternatives for the proposed anesthesia with the patient or authorized representative who has indicated his/her understanding and acceptance.     Dental advisory given  Plan Discussed with: CRNA and Anesthesiologist  Anesthesia Plan Comments:         Anesthesia Quick Evaluation

## 2019-02-24 NOTE — Discharge Instructions (Addendum)
LAPAROSCOPIC SURGERY: POST OP INSTRUCTIONS  ######################################################################  EAT Gradually transition to a high fiber diet with a fiber supplement over the next few weeks after discharge.  Start with a pureed / full liquid diet (see below)  WALK Walk an hour a day.  Control your pain to do that.    CONTROL PAIN Control pain so that you can walk, sleep, tolerate sneezing/coughing, go up/down stairs.  HAVE A BOWEL MOVEMENT DAILY Keep your bowels regular to avoid problems.  OK to try a laxative to override constipation.  OK to use an antidairrheal to slow down diarrhea.  Call if not better after 2 tries  CALL IF YOU HAVE PROBLEMS/CONCERNS Call if you are still struggling despite following these instructions. Call if you have concerns not answered by these instructions  ######################################################################    1. DIET: Follow a light bland diet & liquids the first 24 hours after arrival home, such as soup, liquids, starches, etc.  Be sure to drink plenty of fluids.  Quickly advance to a usual solid diet within a few days.  Avoid fast food or heavy meals as your are more likely to get nauseated or have irregular bowels.  A low-sugar, high-fiber diet for the rest of your life is ideal.  2. Take your usually prescribed home medications unless otherwise directed.  3. PAIN CONTROL: a. Pain is best controlled by a usual combination of three different methods TOGETHER: i. Ice/Heat ii. Over the counter pain medication iii. Prescription pain medication b. Most patients will experience some swelling and bruising around the incisions.  Ice packs or heating pads (30-60 minutes up to 6 times a day) will help. Use ice for the first few days to help decrease swelling and bruising, then switch to heat to help relax tight/sore spots and speed recovery.  Some people prefer to use ice alone, heat alone, alternating between ice & heat.   Experiment to what works for you.  Swelling and bruising can take several weeks to resolve.   i. It is helpful to take an over-the-counter pain medication regularly for the first few days:   ii. Naproxen (Aleve, etc)  Two 220mg  tabs twice a day OR Ibuprofen (Advil, etc) Three 200mg  tabs four times a day (every meal & bedtime) AND iii. Acetaminophen (Tylenol, etc) 500-650mg  four times a day (every meal & bedtime) c. A  prescription for pain medication (such as oxycodone, hydrocodone, tramadol, gabapentin, methocarbamol, etc) should be given to you upon discharge.  Take your pain medication as prescribed.  i. If you are having problems/concerns with the prescription medicine (does not control pain, nausea, vomiting, rash, itching, etc), please call us [phone] to see if we need to switch you to a different pain medicine that will work better for you and/or control your side effect better. ii. If you need a refill on your pain medication, please give [place] 48 hour notice.  contact your pharmacy.  They will contact our office to request authorization. Prescriptions will not be filled after 5 pm or on week-ends  4. Avoid getting constipated.   a. Between the surgery and the pain medications, it is common to experience some constipation.   b. Increasing fluid intake and taking a fiber supplement (such as Metamucil, Citrucel, FiberCon, MiraLax, etc) 1-2 times a day regularly will usually help prevent this problem from occurring.   c. A mild laxative (prune juice, Milk of Magnesia, MiraLax, etc) should be taken according to package directions if there are no bowel movements  after 48 hours.   5. Watch out for diarrhea.   a. If you have many loose bowel movements, simplify your diet to bland foods & liquids for a few days.   b. Stop any stool softeners and decrease your fiber supplement.   c. Switching to mild anti-diarrheal medications (Kayopectate, Pepto Bismol) can help.   d. If this worsens or does not  improve, please call us.  6. Wash / shower every day.  You may shower over the skin glue which is waterproof.  Continue to shower over incision(s) after the dressing is off.  7. Glue will flake off after 2 weeks.  You may leave the incision open to air.  You may replace a dressing/Band-Aid to cover the incision for comfort if you wish.   8. ACTIVITIES as tolerated:   a. You may resume regular (light) daily activities beginning the next day--such as daily self-care, walking, climbing stairs--gradually increasing activities as tolerated.  If you can walk 30 minutes without difficulty, it is safe to try more intense activity such as jogging, treadmill, bicycling, low-impact aerobics, swimming, etc. b. Save the most intensive and strenuous activity for last such as sit-ups, heavy lifting, contact sports, etc  Refrain from any heavy lifting or straining until you are off narcotics for pain control.   c. DO NOT PUSH THROUGH PAIN.  Let pain be your guide: If it hurts to do something, don't do it.  Pain is your body warning you to avoid that activity for another week until the pain goes down. d. You may drive when you are no longer taking prescription pain medication, you can comfortably wear a seatbelt, and you can safely maneuver your car and apply brakes. e. Dennis Bast may have sexual intercourse when it is comfortable.  9. FOLLOW UP in our office a. Please call [facility] at [phone] to set up an appointment to see your surgeon in the office for a follow-up appointment approximately 2-3 weeks after your surgery. b. Make sure that you call for this appointment the day you arrive home to insure a convenient appointment time.  10. IF YOU HAVE DISABILITY OR FAMILY LEAVE FORMS, BRING THEM TO THE OFFICE FOR PROCESSING.  DO NOT GIVE THEM TO YOUR DOCTOR.   WHEN TO CALL us [phone]: 1. Poor pain control 2. Reactions / problems with new medications (rash/itching, nausea, etc)  3. Fever over 101.5 F (38.5  C) 4. Inability to urinate 5. Nausea and/or vomiting 6. Worsening swelling or bruising 7. Continued bleeding from incision. 8. Increased pain, redness, or drainage from the incision   The clinic staff is available to answer your questions during regular business hours (8:30am-5pm).  Please dont hesitate to call and ask to speak to one of our nurses for clinical concerns.   If you have a medical emergency, go to the nearest emergency room or call 911.  A surgeon from [facility] is always on call at the [facility], [place], [place], [place], [place]  [address] ? MAIN: [phone] ? TOLL FREE: [phone] ?  FAX [phone] [email or web address]

## 2019-02-24 NOTE — Anesthesia Postprocedure Evaluation (Signed)
Anesthesia Post Note  Patient: Kari Lewis  Procedure(s) Performed: LAPAROSCOPIC CHOLECYSTECTOMY (N/A )     Patient location during evaluation: PACU Anesthesia Type: General Level of consciousness: awake and alert Pain management: pain level controlled Vital Signs Assessment: post-procedure vital signs reviewed and stable Respiratory status: spontaneous breathing, nonlabored ventilation, respiratory function stable and patient connected to nasal cannula oxygen Cardiovascular status: blood pressure returned to baseline and stable Postop Assessment: no apparent nausea or vomiting Anesthetic complications: no    Last Vitals:  Vitals:   02/24/19 1215 02/24/19 1224  BP:  118/85  Pulse: 79   Resp: 15   Temp: 36.5 C   SpO2: 94% 90%    Last Pain:  Vitals:   02/24/19 1145  TempSrc:   PainSc: 0-No pain                 Audry Pili

## 2019-02-24 NOTE — Op Note (Signed)
Operative Note  Kari Lewis 53 y.o. female [id number]  02/24/2019  Surgeon: Clovis Riley MD FACS  Assistant: Carlena Hurl PA-C  Procedure performed: Laparoscopic Cholecystectomy  Preop diagnosis: biliary Post-op diagnosis/intraop findings: same, chronic cholecystitis, enlarged hepatosteatotic liver  Specimens: gallbladder  Retained items: none  EBL: minimal  Complications: none  Description of procedure: After obtaining informed consent the patient was brought to the operating room. Antibiotics were administered. SCD's were applied. General endotracheal anesthesia was initiated and a formal time-out was performed. The abdomen was prepped and draped in the usual sterile fashion and the abdomen was entered using an optical entry in the right upper quadrant after instilling the site with local. Insufflation to 13mmHg was obtained, 46mm trocar and camera inserted, and gross inspection revealed no evidence of injury from our entry or other intraabdominal abnormalities. There are omental adhesions along the left lateral abdomen. The liver is enlarged and steatotic. Two 19mm trocars were introduced in the supraumbilical and right anterior axillary lines under direct visualization and following infiltration with local. An 26mm trocar was placed in the epigastrium. The gallbladder was retracted cephalad and the infundibulum was retracted laterally. A combination of hook electrocautery and blunt dissection was utilized to lyse omental adhesions to the gallbladder and then to clear the peritoneum from the neck and cystic duct, circumferentially isolating the cystic artery and cystic duct and lifting the gallbladder from the cystic plate. The critical view of safety was achieved with the cystic artery, cystic duct, and liver bed visualized between them with no other structures. The artery was clipped with a single clip proximally and distally and divided as was the cystic duct with three clips on  the proximal end. A bleeding entering the anterior the gallbladder body wall was clipped as well. The gallbladder was dissected from the liver plate using electrocautery. Once freed the gallbladder was placed in an endocatch bag and removed through the epigastric trocar site. A small amount of bleeding on the liver bed was controlled with cautery and surgicel snow. Some bile had been spilled from the gallbladder during its dissection from the liver bed. This was aspirated and the right upper quadrant was irrigated copiously until the effluent was clear. Hemostasis was once again confirmed, and reinspection of the abdomen revealed no injuries. The clips were well opposed without any bile leak from the duct or the liver bed. The 38mm trocar site in the epigastrium was closed with a 0 vicryl in the fascia under direct visualization using a PMI device. The abdomen was desufflated and all trocars removed. The skin incisions were closed with subcuticular monocryl and Dermabond. The patient was awakened, extubated and transported to the recovery room in stable condition.    All counts were correct at the completion of the case.

## 2019-02-24 NOTE — Anesthesia Procedure Notes (Signed)
Procedure Name: Intubation Date/Time: 02/24/2019 9:15 AM Performed by: Clearnce Sorrel, CRNA Pre-anesthesia Checklist: Patient identified, Emergency Drugs available, Suction available, Patient being monitored and Timeout performed Patient Re-evaluated:Patient Re-evaluated prior to induction Oxygen Delivery Method: Circle system utilized Preoxygenation: Pre-oxygenation with 100% oxygen Induction Type: IV induction Ventilation: Mask ventilation without difficulty Laryngoscope Size: Mac and 3 Grade View: Grade I Tube type: Oral Tube size: 7.0 mm Number of attempts: 1 Airway Equipment and Method: Stylet Placement Confirmation: ETT inserted through vocal cords under direct vision,  positive ETCO2 and breath sounds checked- equal and bilateral Secured at: 22 cm Tube secured with: Tape Dental Injury: Teeth and Oropharynx as per pre-operative assessment

## 2019-02-24 NOTE — Interval H&P Note (Signed)
History and Physical Interval Note:  02/24/2019 8:02 AM  Kari Lewis  has presented today for surgery, with the diagnosis of BILIARY COLIC.  The various methods of treatment have been discussed with the patient and family. After consideration of risks, benefits and other options for treatment, the patient has consented to  Procedure(s): LAPAROSCOPIC CHOLECYSTECTOMY (N/A) as a surgical intervention.  The patient's history has been reviewed, patient examined, no change in status, stable for surgery.  I have reviewed the patient's chart and labs.  Questions were answered to the patient's satisfaction.     Piera Downs Rich Brave

## 2019-02-25 ENCOUNTER — Encounter (HOSPITAL_COMMUNITY): Payer: Self-pay | Admitting: Surgery

## 2019-02-25 ENCOUNTER — Other Ambulatory Visit: Payer: Self-pay | Admitting: Family Medicine

## 2019-02-25 LAB — SURGICAL PATHOLOGY

## 2019-03-29 ENCOUNTER — Telehealth: Payer: Self-pay | Admitting: Family Medicine

## 2019-03-30 ENCOUNTER — Other Ambulatory Visit: Payer: Self-pay | Admitting: Family Medicine

## 2019-04-01 ENCOUNTER — Other Ambulatory Visit: Payer: Self-pay | Admitting: Family Medicine

## 2019-04-01 MED ORDER — VENLAFAXINE HCL ER 150 MG PO CP24: 150.0000 mg | ORAL_CAPSULE | Freq: Every day | ORAL | 3 refills | Status: DC

## 2019-04-01 NOTE — Telephone Encounter (Signed)
I sent in the refills to [facility]

## 2019-04-01 NOTE — Telephone Encounter (Signed)
Pt called in to find out why was refill denied?   Please assist.

## 2019-04-01 NOTE — Telephone Encounter (Signed)
Ok to refill 

## 2019-04-01 NOTE — Addendum Note (Signed)
Addended by: Alysia Penna A on: 04/01/2019 04:52 PM   Modules accepted: Orders

## 2019-04-02 NOTE — Telephone Encounter (Signed)
Left detailed message informing pt of update. 

## 2019-05-26 ENCOUNTER — Other Ambulatory Visit: Payer: Self-pay

## 2019-05-26 ENCOUNTER — Encounter: Payer: Self-pay | Admitting: Family Medicine

## 2019-05-26 ENCOUNTER — Ambulatory Visit (INDEPENDENT_AMBULATORY_CARE_PROVIDER_SITE_OTHER): Payer: BC Managed Care – PPO | Admitting: Family Medicine

## 2019-05-26 VITALS — BP 132/62 | HR 94 | Temp 98.0°F | Wt 229.4 lb

## 2019-05-26 DIAGNOSIS — H9222 Otorrhagia, left ear: Secondary | ICD-10-CM | POA: Diagnosis not present

## 2019-05-26 NOTE — Progress Notes (Signed)
   Subjective:    Patient ID: Kari Lewis, female    DOB: 10-13-1965, 54 y.o.   MRN: [id number]  HPI Here after she noticed some blood trickling out of the left ear yesterday. There has been no pain. She denies ever using Q Tips in her ears.    Review of Systems  Constitutional: Negative.   HENT: Positive for ear discharge. Negative for congestion, ear pain, hearing loss, nosebleeds, postnasal drip and sinus pressure.   Eyes: Negative.   Respiratory: Negative.        Objective:   Physical Exam Constitutional:      Appearance: Normal appearance.  HENT:     Head: Normocephalic and atraumatic.     Right Ear: Tympanic membrane, ear canal and external ear normal.     Left Ear: Tympanic membrane and external ear normal.     Ears:     Comments: The left external canal has a small clot of blood sitting on the bottom, no other signs of trauma    Nose: Nose normal.     Mouth/Throat:     Pharynx: Oropharynx is clear.  Eyes:     Conjunctiva/sclera: Conjunctivae normal.  Cardiovascular:     Rate and Rhythm: Normal rate and regular rhythm.     Pulses: Normal pulses.     Heart sounds: Normal heart sounds.  Pulmonary:     Effort: Pulmonary effort is normal.     Breath sounds: Normal breath sounds.  Neurological:     Mental Status: She is alert.           Assessment & Plan:  It appears that she had a small pimple that opened in the ear canal. No signs of infection. I advised her to leave it alone and not to flush the ear so as to avoid dislodging the clot. Recheck prn. Alysia Penna, MD

## 2019-05-27 ENCOUNTER — Other Ambulatory Visit: Payer: Self-pay | Admitting: Family Medicine

## 2019-06-12 ENCOUNTER — Other Ambulatory Visit: Payer: Self-pay | Admitting: Family Medicine

## 2019-06-24 ENCOUNTER — Ambulatory Visit: Payer: BC Managed Care – PPO | Admitting: Physician Assistant

## 2019-06-24 ENCOUNTER — Other Ambulatory Visit: Payer: Self-pay

## 2019-06-24 ENCOUNTER — Encounter: Payer: Self-pay | Admitting: Physician Assistant

## 2019-06-24 DIAGNOSIS — L0212 Furuncle of neck: Secondary | ICD-10-CM | POA: Diagnosis not present

## 2019-06-24 MED ORDER — DOXYCYCLINE HYCLATE 100 MG PO TABS: 100.0000 mg | ORAL_TABLET | Freq: Two times a day (BID) | ORAL | 1 refills | Status: DC

## 2019-06-24 NOTE — Progress Notes (Signed)
   Follow up Visit  Subjective  Kari Lewis is a 54 y.o. female who presents for the following: Skin Problem (Patient here today for cyst under her chin x 5 days. Per patient cyst is painful, warm to touch, and no drainage). Area became sore last week and now is red and swollen and hot. No drainage. She has tried to put ice on it.   Objective  Well appearing patient in no apparent distress; mood and affect are within normal limits.  A focused examination was performed including neck. Relevant physical exam findings are noted in the Assessment and Plan. No suspicious moles noted on back.   Objective  Neck - Anterior: Firm Inflamed nodule anterior neck.  Assessment & Plan  Furuncle of neck Neck - Anterior     Incision and Drainage - Neck - Anterior Location: anterior neck  Informed Consent: Discussed risks (permanent scarring, light or dark discoloration, infection, pain, bleeding, bruising, redness, damage to adjacent structures, and recurrence of the lesion) and benefits of the procedure, as well as the alternatives.  Informed consent was obtained.  Preparation: The area was prepped with chlorhexidine.  Anesthesia: Lidocaine 1% with epinephrine  Procedure Details: An incision was made overlying the lesion. The lesion drained no drainage.    Antibiotic ointment and a sterile pressure dressing were applied. The patient tolerated procedure well.  Total number of lesions drained: 1  Plan: The patient was instructed on post-op care. Recommend OTC analgesia as needed for pain.   Intralesional injection - Neck - Anterior Location: ANTERIOR NECK  Informed Consent: Discussed risks (infection, pain, bleeding, bruising, thinning of the skin, loss of skin pigment,  Indentation, lack of resolution, and recurrence of lesion) and benefits of the procedure, as well as the alternatives. Informed consent was obtained. Preparation: The area was prepared in a standard  fashion.   Procedure Details: An intralesional injection was performed with Kenalog 5 mg/cc. Marland Kitchen05 cc in total were injected.  Total number of injections: 1  Plan: The patient was instructed on post-op care. Recommend OTC analgesia as needed for pain.   Ordered Medications: doxycycline (VIBRA-TABS) 100 MG tablet

## 2019-06-27 ENCOUNTER — Telehealth: Payer: Self-pay

## 2019-06-27 NOTE — Telephone Encounter (Signed)
Left voicemail for patient to call.  Per JCB, call to see how her cyst is doing after the I&D.

## 2019-07-25 ENCOUNTER — Encounter: Payer: Self-pay | Admitting: Family Medicine

## 2019-07-25 ENCOUNTER — Ambulatory Visit (INDEPENDENT_AMBULATORY_CARE_PROVIDER_SITE_OTHER): Payer: BC Managed Care – PPO | Admitting: Family Medicine

## 2019-07-25 ENCOUNTER — Other Ambulatory Visit: Payer: Self-pay

## 2019-07-25 VITALS — BP 102/70 | HR 75 | Temp 97.5°F | Resp 12 | Ht 62.0 in | Wt 225.1 lb

## 2019-07-25 DIAGNOSIS — R221 Localized swelling, mass and lump, neck: Secondary | ICD-10-CM

## 2019-07-25 DIAGNOSIS — R040 Epistaxis: Secondary | ICD-10-CM

## 2019-07-25 LAB — CBC WITH DIFFERENTIAL/PLATELET
Basophils Absolute: 0.1 10*3/uL (ref 0.0–0.1)
Basophils Relative: 0.7 % (ref 0.0–3.0)
Eosinophils Absolute: 0.4 10*3/uL (ref 0.0–0.7)
Eosinophils Relative: 4.4 % (ref 0.0–5.0)
HCT: 40.2 % (ref 36.0–46.0)
Hemoglobin: 13.4 g/dL (ref 12.0–15.0)
Lymphocytes Relative: 39.9 % (ref 12.0–46.0)
Lymphs Abs: 3.9 10*3/uL (ref 0.7–4.0)
MCHC: 33.2 g/dL (ref 30.0–36.0)
MCV: 84.4 fl (ref 78.0–100.0)
Monocytes Absolute: 0.7 10*3/uL (ref 0.1–1.0)
Monocytes Relative: 7.6 % (ref 3.0–12.0)
Neutro Abs: 4.7 10*3/uL (ref 1.4–7.7)
Neutrophils Relative %: 47.4 % (ref 43.0–77.0)
Platelets: 445 10*3/uL — ABNORMAL HIGH (ref 150.0–400.0)
RBC: 4.77 Mil/uL (ref 3.87–5.11)
RDW: 13 % (ref 11.5–15.5)
WBC: 9.8 10*3/uL (ref 4.0–10.5)

## 2019-07-25 NOTE — Progress Notes (Signed)
ACUTE VISIT     Chief Complaint  Patient presents with  . Knot on neck    Knot on left side of neck, no pain, noticed on yesterday  . Epistaxis    everyday for the past 2 weeks   HPI: Kari Lewis is a 54 y.o. female, who is here today with above concern. Yesterday her husband called to her attention left-sided submandibular mass. Lesion is not tender, she has not noted skin changes.  She is not sure for how long the lesion has been present. She has not noted sore throat, earache, oral lesions, or dysphagia. No recent URI.  She has had some dry mouth, problem attributed to some of her medications.  She denies tobacco use. She is also concerned about recurrent nosebleed,left side. No hx of trauma.  She has had problem in the past but it seems to be more frequent for the past 2 weeks. She has not identified exacerbating factors. Problem alleviated by applying pressure for about 10 minutes. + Nasal congestion and pruritus.  Negative for gum bleeding, melena, easy bruising, blood in the stool, or cough hematuria. She denies fever, chills, night sweats, or abnormal weight loss.  No history of radiation exposure except for annual mammograms, a couple of plain CXR,, and abdominal CT in 2016.  Review of Systems  Constitutional: Negative for activity change, appetite change and fatigue.  HENT: Negative for facial swelling and trouble swallowing.   Eyes: Negative for redness and visual disturbance.  Respiratory: Negative for cough, shortness of breath and wheezing.   Cardiovascular: Negative for chest pain and leg swelling.  Gastrointestinal: Negative for abdominal pain, nausea and vomiting.       Negative for changes in bowel habits.  Musculoskeletal: Negative for gait problem and myalgias.  Skin: Negative for pallor and rash.  Allergic/Immunologic: Positive for environmental allergies.  Neurological: Negative for weakness, numbness and headaches.  Rest see  pertinent positives and negatives per HPI.  Current Outpatient Medications on File Prior to Visit  Medication Sig Dispense Refill  . amLODipine (NORVASC) 10 MG tablet TAKE 1 TABLET BY MOUTH EVERY DAY 90 tablet 0  . aspirin EC 81 MG tablet Take 81 mg by mouth at bedtime.    . Calcium Carbonate-Vit D-Min (CALCIUM 1200 PO) Take 1,200 mg by mouth at bedtime.    . cholecalciferol (VITAMIN D3) 25 MCG (1000 UT) tablet Take 1,000 Units by mouth at bedtime.     Marland Kitchen doxycycline (VIBRA-TABS) 100 MG tablet Take 1 tablet (100 mg total) by mouth in the morning and at bedtime. 60 tablet 1  . escitalopram (LEXAPRO) 10 MG tablet TAKE 1 TABLET(10 MG) BY MOUTH DAILY 30 tablet 5  . hydrochlorothiazide (HYDRODIURIL) 25 MG tablet TAKE 1 TABLET BY MOUTH DAILY (Patient taking differently: Take 25 mg by mouth at bedtime. ) 90 tablet 3  . levonorgestrel (MIRENA) 20 MCG/24HR IUD 1 each by Intrauterine route once.    . Melatonin 10 MG SUBL Place 10 mg under the tongue at bedtime.     . metoprolol succinate (TOPROL-XL) 50 MG 24 hr tablet TAKE 1 TABLET BY MOUTH EVERY DAY IMMEDIATELY FOLLOWING A MEAL 30 tablet 3  . omeprazole (PRILOSEC) 40 MG capsule TAKE 1 CAPSULE(40 MG) BY MOUTH DAILY 30 capsule 5  . potassium chloride (K-DUR) 10 MEQ tablet TAKE 1 TABLET BY MOUTH EVERY DAY (Patient taking differently: Take 10 mEq by mouth at bedtime. TAKE 1 TABLET BY MOUTH EVERY DAY) 90 tablet 3  .  tolterodine (DETROL) 1 MG tablet Take 1 mg by mouth at bedtime.    Marland Kitchen venlafaxine XR (EFFEXOR-XR) 150 MG 24 hr capsule Take 1 capsule (150 mg total) by mouth daily. 90 capsule 3   No current facility-administered medications on file prior to visit.   Past Medical History:  Diagnosis Date  . Allergy   . Anxiety   . Complication of anesthesia    nausea  . Hypertension   . Leukocytosis    chronic & benign  . Pneumonia    >10 yrs   . PONV (postoperative nausea and vomiting)   . PPD positive, treated    with INH for 6 months   Allergies    Allergen Reactions  . Lisinopril Cough    Social History   Socioeconomic History  . Marital status: Married    Spouse name: Not on file  . Number of children: Not on file  . Years of education: Not on file  . Highest education level: Not on file  Occupational History  . Not on file  Tobacco Use  . Smoking status: Former Research scientist (life sciences)  . Smokeless tobacco: Never Used  Substance and Sexual Activity  . Alcohol use: No    Alcohol/week: 0.0 standard drinks  . Drug use: No  . Sexual activity: Not on file  Other Topics Concern  . Not on file  Social History Narrative  . Not on file   Social Determinants of Health   Financial Resource Strain:   . Difficulty of Paying Living Expenses:   Food Insecurity:   . Worried About Charity fundraiser in the Last Year:   . Arboriculturist in the Last Year:   Transportation Needs:   . Film/video editor (Medical):   Marland Kitchen Lack of Transportation (Non-Medical):   Physical Activity:   . Days of Exercise per Week:   . Minutes of Exercise per Session:   Stress:   . Feeling of Stress :   Social Connections:   . Frequency of Communication with Friends and Family:   . Frequency of Social Gatherings with Friends and Family:   . Attends Religious Services:   . Active Member of Clubs or Organizations:   . Attends Archivist Meetings:   Marland Kitchen Marital Status:     Vitals:   07/25/19 1032  BP: 102/70  Pulse: 75  Resp: 12  Temp: (!) 97.5 F (36.4 C)  SpO2: 97%   Body mass index is 41.18 kg/m.  Physical Exam  Nursing note and vitals reviewed. Constitutional: She is oriented to person, place, and time. She appears well-developed. She does not appear ill. No distress.  HENT:  Head: Normocephalic and atraumatic.  Right Ear: Tympanic membrane, external ear and ear canal normal.  Left Ear: Tympanic membrane, external ear and ear canal normal.  Nose: No rhinorrhea, nasal deformity or nasal septal hematoma.  Mouth/Throat: Uvula is midline,  oropharynx is clear and moist and mucous membranes are normal.  Telangiectasias appreciated septum nasal mucosa, L>R. Left nostril with no active bleeding, dry bloody crust and crusty/whitish areas. Hyperemic nasal mucosa.  Eyes: Conjunctivae are normal.  Neck:    Cardiovascular: Normal rate and regular rhythm.  No murmur heard. Respiratory: Effort normal and breath sounds normal. No respiratory distress.  Musculoskeletal:     Cervical back: No edema or erythema. No muscular tenderness.  Lymphadenopathy:       Head (right side): No submental, no submandibular, no preauricular and no posterior auricular adenopathy present.  Head (left side): Submandibular adenopathy present. No submental, no preauricular and no posterior auricular adenopathy present.    She has no cervical adenopathy.  Left submandibular nodular ,not tender lesion. It is not mobile. No skin changes.  Neurological: She is alert and oriented to person, place, and time. She has normal strength.  Skin: Skin is warm. No rash noted. No erythema.  Psychiatric: She has a normal mood and affect.  Well groomed, good eye contact.    ASSESSMENT AND PLAN:  Kari Lewis was seen today for knot on neck and epistaxis.  Diagnoses and all orders for this visit:  Lab Results  Component Value Date   WBC 9.8 07/25/2019   HGB 13.4 07/25/2019   HCT 40.2 07/25/2019   MCV 84.4 07/25/2019   PLT 445.0 (H) 07/25/2019    Neck mass We discussed possible etiologies. He does not seem to be infectious, so I am not recommending oral antibiotics. Malignancy needs to be considered. Continue monitoring for changes. Further recommendations will be given according to imaging/lab results.  -     CT Soft Tissue Neck W Contrast; Future -     CBC with Differential/Platelet  Epistaxis, recurrent Problem is not active at this time. For now she prefers to hold on ENT evaluation but it needs to be considered if problem continues or gets  worse. Avoid certain activities that could increase the risk of bleeding like blowing/scratching nose.  -     CBC with Differential/Platelet    Return if symptoms worsen or fail to improve.   Shain Pauwels G. Martinique, MD  [facility]. [place] office.

## 2019-07-25 NOTE — Patient Instructions (Signed)
A few things to remember from today's visit:  We will arrange appointment for neck CT. Avoid scratching or blowing nose. [place] on nasal mucosa may help.   Nosebleed, Adult A nosebleed is when blood comes out of the nose. Nosebleeds are common. Usually, they are not a sign of a serious condition. Nosebleeds can happen if a small blood vessel in your nose starts to bleed or if the lining of your nose (mucous membrane) cracks. They are commonly caused by:  Allergies.  Colds.  Picking your nose.  Blowing your nose too hard.  An injury from sticking an object into your nose or getting hit in the nose.  Dry or cold air. Less common causes of nosebleeds include:  Toxic fumes.  Something abnormal in the nose or in the air-filled spaces in the bones of the face (sinuses).  Growths in the nose, such as polyps.  Medicines or conditions that cause blood to clot slowly.  Certain illnesses or procedures that irritate or dry out the nasal passages. Follow these instructions at home: When you have a nosebleed:   Sit down and tilt your head slightly forward.  Use a clean towel or tissue to pinch your nostrils under the bony part of your nose. After 10 minutes, let go of your nose and see if bleeding starts again. Do not release pressure before that time. If there is still bleeding, repeat the pinching and holding for 10 minutes until the bleeding stops.  Do not place tissues or gauze in the nose to stop bleeding.  Avoid lying down and avoid tilting your head backward. That may make blood collect in the throat and cause gagging or coughing.  Use a nasal spray decongestant to help with a nosebleed as told by your health care provider.  Do not use petroleum jelly or mineral oil in your nose. It can drip into your lungs. After a nosebleed:  Avoid blowing your nose or sniffing for a number of hours.  Avoid straining, lifting, or bending at the waist for several days. You may resume other  normal activities as you are able.  Use saline spray or a humidifier as told by your health care provider.  Aspirinand blood thinners make bleeding more likely. If you are prescribed these medicines and you suffer from nosebleeds: ? Ask your health care provider if you should stop taking the medicines or if you should adjust the dose. ? Do not stop taking medicines that your health care provider has recommended unless told by your health care provider.  If your nosebleed was caused by dry mucous membranes, use over-the-counter saline nasal spray or gel. This will keep the mucous membranes moist and allow them to heal. If you must use a lubricant: ? Choose one that is water-soluble. ? Use only as much as you need and use it only as often as needed. ? Do not lie down until several hours after you use it. Contact a health care provider if:  You have a fever.  You get nosebleeds often or more often than usual.  You bruise very easily.  You have a nosebleed from having something stuck in your nose.  You have bleeding in your mouth.  You vomit or cough up brown material.  You have a nosebleed after you start a new medicine. Get help right away if:  You have a nosebleed after a fall or a head injury.  Your nosebleed does not go away after 20 minutes.  You feel dizzy or  weak.  You have unusual bleeding from other parts of your body.  You have unusual bruising on other parts of your body.  You become sweaty.  You vomit blood. This information is not intended to replace advice given to you by your health care provider. Make sure you discuss any questions you have with your health care provider. Document Revised: 06/19/2017 Document Reviewed: 10/05/2015 Elsevier Patient Education  [place].  Please be sure medication list is accurate. If a new problem present, please set up appointment sooner than planned today.

## 2019-07-26 ENCOUNTER — Encounter: Payer: Self-pay | Admitting: Family Medicine

## 2019-07-28 ENCOUNTER — Telehealth: Payer: Self-pay | Admitting: Family Medicine

## 2019-07-28 NOTE — Telephone Encounter (Signed)
Result note read to patient. Patient verbalized understanding.  

## 2019-07-28 NOTE — Telephone Encounter (Signed)
Pt was calling about her lab results and would like a call back. The appt was with Martinique    Pt can be reached at [phone]

## 2019-07-29 ENCOUNTER — Other Ambulatory Visit: Payer: Self-pay | Admitting: Family Medicine

## 2019-07-31 ENCOUNTER — Telehealth: Payer: Self-pay | Admitting: Family Medicine

## 2019-07-31 NOTE — Telephone Encounter (Signed)
Kari Lewis was going to set her up or a CT scan and no one has called her.   I gave the patient the information for the referral and she said that she will contact [place].

## 2019-08-11 NOTE — Addendum Note (Signed)
Addended by: Arlyss Gandy R on: 08/11/2019 10:13 AM   Modules accepted: Level of Service

## 2019-08-15 ENCOUNTER — Other Ambulatory Visit: Payer: BC Managed Care – PPO

## 2019-08-20 ENCOUNTER — Other Ambulatory Visit: Payer: Self-pay | Admitting: Family Medicine

## 2019-08-22 ENCOUNTER — Ambulatory Visit
Admission: RE | Admit: 2019-08-22 | Discharge: 2019-08-22 | Disposition: A | Discharge status: Home / Self Care | Payer: BC Managed Care – PPO | Source: Ambulatory Visit | Attending: Family Medicine | Admitting: Family Medicine

## 2019-08-22 ENCOUNTER — Other Ambulatory Visit: Payer: Self-pay

## 2019-08-22 DIAGNOSIS — R221 Localized swelling, mass and lump, neck: Secondary | ICD-10-CM

## 2019-08-22 MED ORDER — IOPAMIDOL (ISOVUE-300) INJECTION 61%
75.0000 mL | Freq: Once | INTRAVENOUS | Status: AC | PRN
  Administered 2019-08-22: 75 mL via INTRAVENOUS

## 2019-08-25 ENCOUNTER — Telehealth: Payer: Self-pay | Admitting: Family Medicine

## 2019-08-25 NOTE — Telephone Encounter (Signed)
Pt is calling to see if she can get the results of her CT that was done on Friday 08/22/2019 she was wanting to get it from Dr. Martinique since she was the one that sent her.

## 2019-08-25 NOTE — Telephone Encounter (Signed)
Patient informed that Dr. Martinique hasn't viewed them yet and soon as she does, the office will call her with results.

## 2019-08-26 ENCOUNTER — Other Ambulatory Visit: Payer: Self-pay | Admitting: *Deleted

## 2019-08-26 DIAGNOSIS — Z01419 Encounter for gynecological examination (general) (routine) without abnormal findings: Secondary | ICD-10-CM | POA: Diagnosis not present

## 2019-08-26 DIAGNOSIS — R102 Pelvic and perineal pain: Secondary | ICD-10-CM | POA: Diagnosis not present

## 2019-08-26 DIAGNOSIS — Z6841 Body Mass Index (BMI) 40.0 and over, adult: Secondary | ICD-10-CM | POA: Diagnosis not present

## 2019-08-26 DIAGNOSIS — N3281 Overactive bladder: Secondary | ICD-10-CM | POA: Diagnosis not present

## 2019-08-26 DIAGNOSIS — R221 Localized swelling, mass and lump, neck: Secondary | ICD-10-CM

## 2019-08-26 NOTE — Telephone Encounter (Signed)
This has been addressed. ENT referral was placed. Kari Lewis Martinique, MD

## 2019-08-30 ENCOUNTER — Other Ambulatory Visit: Payer: Self-pay | Admitting: Family Medicine

## 2019-09-14 ENCOUNTER — Other Ambulatory Visit: Payer: Self-pay | Admitting: Family Medicine

## 2019-09-23 ENCOUNTER — Encounter (INDEPENDENT_AMBULATORY_CARE_PROVIDER_SITE_OTHER): Payer: Self-pay | Admitting: Otolaryngology

## 2019-09-23 ENCOUNTER — Ambulatory Visit (INDEPENDENT_AMBULATORY_CARE_PROVIDER_SITE_OTHER): Payer: BC Managed Care – PPO | Admitting: Otolaryngology

## 2019-09-23 ENCOUNTER — Other Ambulatory Visit: Payer: Self-pay

## 2019-09-23 VITALS — Temp 96.3°F

## 2019-09-23 DIAGNOSIS — R599 Enlarged lymph nodes, unspecified: Secondary | ICD-10-CM

## 2019-09-23 NOTE — Progress Notes (Addendum)
HPI: Kari Lewis is a 54 y.o. female who presents is referred by Dr. Betty Martinique for evaluation of left neck mass.  On recent examination patient was noted to have a nodule in her left neck in the region of the submandibular gland.  She initially had this evaluated with ultrasound which was suggestive of abnormal lymphadenopathy in the neck and recommended a CT scan.  Patient underwent a CT scan that demonstrated mildly enlarged submandibular gland on the left side compared to the right but no abnormal masses.  She had a few slightly enlarged lymph nodes but nothing that was significantly enlarged or appeared abnormal.  I reviewed the scan with the patient in the office today.  Past Medical History:  Diagnosis Date  . Allergy   . Anxiety   . Complication of anesthesia    nausea  . Hypertension   . Leukocytosis    chronic & benign  . Pneumonia    >10 yrs   . PONV (postoperative nausea and vomiting)   . PPD positive, treated    with INH for 6 months   Past Surgical History:  Procedure Laterality Date  . BIOPSY BREAST     benign 1996  . CHOLECYSTECTOMY N/A 02/24/2019   Procedure: LAPAROSCOPIC CHOLECYSTECTOMY;  Surgeon: Clovis Riley, MD;  Location: [facility] OR;  Service: General;  Laterality: N/A;   Social History   Socioeconomic History  . Marital status: Married    Spouse name: Not on file  . Number of children: Not on file  . Years of education: Not on file  . Highest education level: Not on file  Occupational History  . Not on file  Tobacco Use  . Smoking status: Former Smoker    Packs/day: 0.50    Years: 30.00    Pack years: 15.00    Start date: 1986    Quit date: 2016    Years since quitting: 5.4  . Smokeless tobacco: Never Used  Substance and Sexual Activity  . Alcohol use: No    Alcohol/week: 0.0 standard drinks  . Drug use: No  . Sexual activity: Not on file  Other Topics Concern  . Not on file  Social History Narrative  . Not on file   Social  Determinants of Health   Financial Resource Strain:   . Difficulty of Paying Living Expenses:   Food Insecurity:   . Worried About Charity fundraiser in the Last Year:   . Arboriculturist in the Last Year:   Transportation Needs:   . Film/video editor (Medical):   Marland Kitchen Lack of Transportation (Non-Medical):   Physical Activity:   . Days of Exercise per Week:   . Minutes of Exercise per Session:   Stress:   . Feeling of Stress :   Social Connections:   . Frequency of Communication with Friends and Family:   . Frequency of Social Gatherings with Friends and Family:   . Attends Religious Services:   . Active Member of Clubs or Organizations:   . Attends Archivist Meetings:   Marland Kitchen Marital Status:    Family History  Problem Relation Age of Onset  . Breast cancer Other   . Coronary artery disease Other   . Diabetes Other   . Hyperlipidemia Other   . Hypertension Other   . Lung cancer Other   . Prostate cancer Other   . Stroke Other    Allergies  Allergen Reactions  . Lisinopril Cough  Prior to Admission medications   Medication Sig Start Date End Date Taking? Authorizing Provider  amLODipine (NORVASC) 10 MG tablet TAKE 1 TABLET BY MOUTH EVERY DAY 09/15/19  Yes Laurey Morale, MD  aspirin EC 81 MG tablet Take 81 mg by mouth at bedtime.   Yes [provider]  Calcium Carbonate-Vit D-Min (CALCIUM 1200 PO) Take 1,200 mg by mouth at bedtime.   Yes [provider]  cholecalciferol (VITAMIN D3) 25 MCG (1000 UT) tablet Take 1,000 Units by mouth at bedtime.    Yes [provider]  doxycycline (VIBRA-TABS) 100 MG tablet Take 1 tablet (100 mg total) by mouth in the morning and at bedtime. 06/24/19  Yes Clark-Bruning, Jennifer, PA-C  escitalopram (LEXAPRO) 10 MG tablet TAKE 1 TABLET(10 MG) BY MOUTH DAILY 09/02/19  Yes Laurey Morale, MD  hydrochlorothiazide (HYDRODIURIL) 25 MG tablet TAKE 1 TABLET BY MOUTH DAILY 08/20/19  Yes Laurey Morale, MD   levonorgestrel (MIRENA) 20 MCG/24HR IUD 1 each by Intrauterine route once.   Yes [provider]  Melatonin 10 MG SUBL Place 10 mg under the tongue at bedtime.    Yes [provider]  metoprolol succinate (TOPROL-XL) 50 MG 24 hr tablet TAKE 1 TABLET BY MOUTH EVERY DAY IMMEDIATELY FOLLOWING A MEAL 07/29/19  Yes Laurey Morale, MD  omeprazole (PRILOSEC) 40 MG capsule TAKE 1 CAPSULE(40 MG) BY MOUTH DAILY 05/27/19  Yes Laurey Morale, MD  potassium chloride (KLOR-CON) 10 MEQ tablet TAKE 1 TABLET BY MOUTH EVERY DAY 08/20/19  Yes Laurey Morale, MD  tolterodine (DETROL) 1 MG tablet Take 1 mg by mouth at bedtime. 02/04/19  Yes [provider]  venlafaxine XR (EFFEXOR-XR) 150 MG 24 hr capsule Take 1 capsule (150 mg total) by mouth daily. 04/01/19  Yes Laurey Morale, MD     Positive ROS: Otherwise negative  All other systems have been reviewed and were otherwise negative with the exception of those mentioned in the HPI and as above.  Physical Exam: Constitutional: Alert, well-appearing, no acute distress Ears: External ears without lesions or tenderness. Ear canals are clear bilaterally with intact, clear TMs.  Nasal: External nose without lesions.. On intranasal examination patient has some crusting on the right side of her septum.  She had previously had a nosebleed on the right side but was recommended to use Vaseline in the nose and she has had no further bleeding. Oral: Lips and gums without lesions. Tongue and palate mucosa without lesions. Posterior oropharynx clear. Neck: Submandibular glands are medium sized to palpation.  She does have a slightly prominent left submandibular lymph node adjacent to the gland but measures about 1 cm in size and is mobile and does not feel pathologic.  I do not appreciate any significant lymphadenopathy on manual palpation of the neck on either side.  No palpable parotid masses or submandibular masses. Respiratory: Breathing comfortably   Skin: No facial/neck lesions or rash noted.  Procedures  Assessment: Findings are most consistent with reactive lymphadenopathy which appear benign on recent CT scan.  Plan: Reviewed the scan with the patient in the office today.  No further treatment is warranted unless the lymph node continues to enlarge to 2 cm or greater. Patient will notify us if she has any enlargement of her lymph nodes.   Radene Journey, MD   CC:

## 2019-10-27 ENCOUNTER — Other Ambulatory Visit: Payer: Self-pay | Admitting: Family Medicine

## 2019-10-31 ENCOUNTER — Encounter: Payer: Self-pay | Admitting: Family Medicine

## 2019-10-31 DIAGNOSIS — Z1231 Encounter for screening mammogram for malignant neoplasm of breast: Secondary | ICD-10-CM | POA: Diagnosis not present

## 2019-11-29 ENCOUNTER — Other Ambulatory Visit: Payer: Self-pay | Admitting: Family Medicine

## 2019-12-10 DIAGNOSIS — H43391 Other vitreous opacities, right eye: Secondary | ICD-10-CM | POA: Diagnosis not present

## 2019-12-15 ENCOUNTER — Other Ambulatory Visit: Payer: Self-pay

## 2019-12-15 ENCOUNTER — Encounter: Payer: Self-pay | Admitting: Family Medicine

## 2019-12-15 ENCOUNTER — Ambulatory Visit: Payer: BC Managed Care – PPO | Admitting: Family Medicine

## 2019-12-15 VITALS — BP 100/80 | HR 97 | Temp 98.3°F | Ht 64.5 in | Wt 230.6 lb

## 2019-12-15 DIAGNOSIS — N3001 Acute cystitis with hematuria: Secondary | ICD-10-CM | POA: Diagnosis not present

## 2019-12-15 DIAGNOSIS — M545 Low back pain, unspecified: Secondary | ICD-10-CM

## 2019-12-15 LAB — POCT URINALYSIS DIPSTICK
Bilirubin, UA: NEGATIVE
Blood, UA: POSITIVE
Glucose, UA: NEGATIVE
Ketones, UA: POSITIVE
Nitrite, UA: POSITIVE
Odor: POSITIVE
Protein, UA: POSITIVE — AB
Spec Grav, UA: 1.025 (ref 1.010–1.025)
Urobilinogen, UA: 1 E.U./dL
pH, UA: 6 (ref 5.0–8.0)

## 2019-12-15 MED ORDER — CIPROFLOXACIN HCL 500 MG PO TABS: 500.0000 mg | ORAL_TABLET | Freq: Two times a day (BID) | ORAL | 0 refills | Status: DC

## 2019-12-15 NOTE — Progress Notes (Signed)
   Subjective:    Patient ID: Kari Lewis, female    DOB: 10/19/1965, 54 y.o.   MRN: [id number]  HPI Here for 2 days of lower back pain, urgency to urinate and a dark color to the urine. No burning or nausea or fever. She normally drinks plenty of water every day.    Review of Systems  Constitutional: Negative.   Respiratory: Negative.   Cardiovascular: Negative.   Gastrointestinal: Negative.   Genitourinary: Positive for urgency. Negative for dysuria, frequency and hematuria.  Musculoskeletal: Positive for back pain.       Objective:   Physical Exam Constitutional:      Appearance: Normal appearance. She is not ill-appearing.  Cardiovascular:     Rate and Rhythm: Normal rate and regular rhythm.     Pulses: Normal pulses.     Heart sounds: Normal heart sounds.  Pulmonary:     Effort: Pulmonary effort is normal.     Breath sounds: Normal breath sounds.  Abdominal:     General: Abdomen is flat. Bowel sounds are normal. There is no distension.     Palpations: Abdomen is soft. There is no mass.     Tenderness: There is no abdominal tenderness. There is no right CVA tenderness, left CVA tenderness, guarding or rebound.     Hernia: No hernia is present.  Neurological:     Mental Status: She is alert.           Assessment & Plan:  UTI, treat with Cipro. Culture the sample.  Alysia Penna, MD

## 2019-12-17 ENCOUNTER — Other Ambulatory Visit: Payer: Self-pay | Admitting: Family Medicine

## 2019-12-17 LAB — URINE CULTURE
MICRO NUMBER:: 10942640
SPECIMEN QUALITY:: ADEQUATE

## 2019-12-30 ENCOUNTER — Other Ambulatory Visit: Payer: Self-pay | Admitting: Family Medicine

## 2019-12-30 NOTE — Telephone Encounter (Signed)
Duplicate drug alert -  Lexapro and Effexor when trying to refill Lexapro.  Last labs 12/15/19 Last OV 12/15/19

## 2020-02-24 ENCOUNTER — Telehealth: Payer: Self-pay | Admitting: Family Medicine

## 2020-02-24 ENCOUNTER — Telehealth (INDEPENDENT_AMBULATORY_CARE_PROVIDER_SITE_OTHER): Payer: BC Managed Care – PPO | Admitting: Family Medicine

## 2020-02-24 ENCOUNTER — Encounter: Payer: Self-pay | Admitting: Family Medicine

## 2020-02-24 VITALS — Temp 95.5°F

## 2020-02-24 DIAGNOSIS — U071 COVID-19: Secondary | ICD-10-CM | POA: Insufficient documentation

## 2020-02-24 MED ORDER — TRAMADOL HCL 50 MG PO TABS: 100.0000 mg | ORAL_TABLET | Freq: Four times a day (QID) | ORAL | 0 refills | Status: DC | PRN

## 2020-02-24 NOTE — Telephone Encounter (Signed)
error 

## 2020-02-24 NOTE — Progress Notes (Signed)
° °  Subjective:    Patient ID: Kari Lewis, female    DOB: 01-19-1966, 54 y.o.   MRN: [id number]  HPI Virtual Visit via Telephone Note  I connected with the patient on 02/24/20 at 10:30 AM EST by telephone and verified that I am speaking with the correct person using two identifiers.   I discussed the limitations, risks, security and privacy concerns of performing an evaluation and management service by telephone and the availability of in person appointments. I also discussed with the patient that there may be a patient responsible charge related to this service. The patient expressed understanding and agreed to proceed.  Location patient: home Location provider: work or home office Participants present for the call: patient, provider Patient did not have a visit in the prior 7 days to address this/these issue(s).   History of Present Illness: Here for 5 days of Covid-19 symptoms. These include headache, body aches, loss of smell, diarrhea, and a dry cough. No vomiting or SOB. No one else in her household is sick. She is drinking fluids and taking ibuprofen. She tested positive for the virus on 02-22-20.    Observations/Objective: Patient sounds cheerful and well on the phone. I do not appreciate any SOB. Speech and thought processing are grossly intact. Patient reported vitals:  Assessment and Plan: Covid-19 infection. She can use Tramadol as needed for pain relief and Imodium as needed for diarrhea. She is quarantining at home for 10 days. I also gave her the contact information for the [facility] infusion center.  Alysia Penna, MD   Follow Up Instructions:     [phone] 5-10 [phone] 11-20 [id number] 21-30 I did not refer this patient for an OV in the next 24 hours for this/these issue(s).  I discussed the assessment and treatment plan with the patient. The patient was provided an opportunity to ask questions and all were answered. The patient agreed with the plan and demonstrated an  understanding of the instructions.   The patient was advised to call back or seek an in-person evaluation if the symptoms worsen or if the condition fails to improve as anticipated.  I provided 13 minutes of non-face-to-face time during this encounter.   Alysia Penna, MD    Review of Systems     Objective:   Physical Exam        Assessment & Plan:

## 2020-03-03 ENCOUNTER — Encounter: Payer: Self-pay | Admitting: Family Medicine

## 2020-03-03 ENCOUNTER — Telehealth (INDEPENDENT_AMBULATORY_CARE_PROVIDER_SITE_OTHER): Payer: BC Managed Care – PPO | Admitting: Family Medicine

## 2020-03-03 DIAGNOSIS — I10 Essential (primary) hypertension: Secondary | ICD-10-CM

## 2020-03-03 DIAGNOSIS — U071 COVID-19: Secondary | ICD-10-CM

## 2020-03-03 DIAGNOSIS — R5383 Other fatigue: Secondary | ICD-10-CM | POA: Diagnosis not present

## 2020-03-03 DIAGNOSIS — R739 Hyperglycemia, unspecified: Secondary | ICD-10-CM | POA: Diagnosis not present

## 2020-03-03 NOTE — Progress Notes (Signed)
   Subjective:    Patient ID: Kari Lewis, female    DOB: [birth year], 54 y.o.   MRN: [id number]  HPI Virtual Visit via Telephone Note  I connected with the patient on 03/03/20 at  9:15 AM EST by telephone and verified that I am speaking with the correct person using two identifiers.   I discussed the limitations, risks, security and privacy concerns of performing an evaluation and management service by telephone and the availability of in person appointments. I also discussed with the patient that there may be a patient responsible charge related to this service. The patient expressed understanding and agreed to proceed.  Location patient: home Location provider: work or home office Participants present for the call: patient, provider Patient did not have a visit in the prior 7 days to address this/these issue(s).   History of Present Illness: Here to follow up on a Covid virus infection. She tested positive on 02-22-20. She has ben feeling better in some ways. She is less SOB ,and her oxygen sats have improved from the high 80s to 93% today. She is still very fatigued however and she has become very thirsty, which is unusual for her. She does have a family hx of diabetes.    Observations/Objective: Patient sounds cheerful and well on the phone. I do not appreciate any SOB. Speech and thought processing are grossly intact. Patient reported vitals:  Assessment and Plan: She is recovering from a Covid-19 infection. I am concerned that this may have triggered something else, such as the development of diabetes. She will come by the lab tomorrow to check labs, including an A1c. I also asked her to check her BP and reprot back to [place].  Alysia Penna, MD   Follow Up Instructions:     [phone] [phone] 21-30 I did not refer this patient for an OV in the next 24 hours for this/these issue(s).  I discussed the assessment and treatment plan with the patient. The patient  was provided an opportunity to ask questions and all were answered. The patient agreed with the plan and demonstrated an understanding of the instructions.   The patient was advised to call back or seek an in-person evaluation if the symptoms worsen or if the condition fails to improve as anticipated.  I provided 15 minutes of non-face-to-face time during this encounter.   Alysia Penna, MD    Review of Systems     Objective:   Physical Exam        Assessment & Plan:

## 2020-03-04 ENCOUNTER — Other Ambulatory Visit (INDEPENDENT_AMBULATORY_CARE_PROVIDER_SITE_OTHER): Payer: BC Managed Care – PPO

## 2020-03-04 ENCOUNTER — Other Ambulatory Visit: Payer: Self-pay

## 2020-03-04 DIAGNOSIS — R5383 Other fatigue: Secondary | ICD-10-CM

## 2020-03-04 DIAGNOSIS — R739 Hyperglycemia, unspecified: Secondary | ICD-10-CM

## 2020-03-04 DIAGNOSIS — I10 Essential (primary) hypertension: Secondary | ICD-10-CM | POA: Diagnosis not present

## 2020-03-05 LAB — LIPID PANEL
Cholesterol: 192 mg/dL (ref <200)
HDL: 38 mg/dL — ABNORMAL LOW (ref >=50)
LDL Cholesterol (Calc): 128 mg/dL (calc) — ABNORMAL HIGH
Non-HDL Cholesterol (Calc): 154 mg/dL (calc) — ABNORMAL HIGH (ref <130)
Total CHOL/HDL Ratio: 5.1 (calc) — ABNORMAL HIGH (ref <5.0)
Triglycerides: 150 mg/dL — ABNORMAL HIGH (ref <150)

## 2020-03-05 LAB — BASIC METABOLIC PANEL WITH GFR
BUN: 11 mg/dL (ref 7–25)
CO2: 22 mmol/L (ref 20–32)
Calcium: 9.2 mg/dL (ref 8.6–10.4)
Chloride: 98 mmol/L (ref 98–110)
Creat: 0.65 mg/dL (ref 0.50–1.05)
GFR, Est African American: 117 mL/min/{1.73_m2} (ref >=60)
GFR, Est Non African American: 101 mL/min/{1.73_m2} (ref >=60)
Glucose, Bld: 179 mg/dL — ABNORMAL HIGH (ref 65–99)
Potassium: 3.8 mmol/L (ref 3.5–5.3)
Sodium: 138 mmol/L (ref 135–146)

## 2020-03-05 LAB — CBC WITH DIFFERENTIAL/PLATELET
Absolute Monocytes: 804 cells/uL (ref 200–950)
Basophils Absolute: 98 cells/uL (ref 0–200)
Basophils Relative: 1 %
Eosinophils Absolute: 500 cells/uL (ref 15–500)
Eosinophils Relative: 5.1 %
HCT: 42.7 % (ref 35.0–45.0)
Hemoglobin: 14.5 g/dL (ref 11.7–15.5)
Lymphs Abs: 3116 cells/uL (ref 850–3900)
MCH: 27.8 pg (ref 27.0–33.0)
MCHC: 34 g/dL (ref 32.0–36.0)
MCV: 81.8 fL (ref 80.0–100.0)
MPV: 9.7 fL (ref 7.5–12.5)
Monocytes Relative: 8.2 %
Neutro Abs: 5282 cells/uL (ref 1500–7800)
Neutrophils Relative %: 53.9 %
Platelets: 856 10*3/uL — ABNORMAL HIGH (ref 140–400)
RBC: 5.22 10*6/uL — ABNORMAL HIGH (ref 3.80–5.10)
RDW: 12.7 % (ref 11.0–15.0)
Total Lymphocyte: 31.8 %
WBC: 9.8 10*3/uL (ref 3.8–10.8)

## 2020-03-05 LAB — HEPATIC FUNCTION PANEL
AG Ratio: 1.2 (calc) (ref 1.0–2.5)
ALT: 33 U/L — ABNORMAL HIGH (ref 6–29)
AST: 27 U/L (ref 10–35)
Albumin: 3.7 g/dL (ref 3.6–5.1)
Alkaline phosphatase (APISO): 103 U/L (ref 37–153)
Bilirubin, Direct: 0.1 mg/dL (ref 0.0–0.2)
Globulin: 3.2 g/dL (calc) (ref 1.9–3.7)
Indirect Bilirubin: 0.3 mg/dL (calc) (ref 0.2–1.2)
Total Bilirubin: 0.4 mg/dL (ref 0.2–1.2)
Total Protein: 6.9 g/dL (ref 6.1–8.1)

## 2020-03-05 LAB — HEMOGLOBIN A1C
Hgb A1c MFr Bld: 7.3 % of total Hgb — ABNORMAL HIGH (ref <5.7)
Mean Plasma Glucose: 163 (calc)
eAG (mmol/L): 9 (calc)

## 2020-03-05 LAB — TSH: TSH: 2.77 mIU/L

## 2020-03-05 LAB — VITAMIN B12: Vitamin B-12: 1028 pg/mL (ref 200–1100)

## 2020-03-05 LAB — T4, FREE: Free T4: 1.5 ng/dL (ref 0.8–1.8)

## 2020-03-05 LAB — T3, FREE: T3, Free: 3.1 pg/mL (ref 2.3–4.2)

## 2020-03-09 ENCOUNTER — Telehealth: Payer: Self-pay | Admitting: Family Medicine

## 2020-03-09 NOTE — Telephone Encounter (Signed)
Patient is calling and wanted to know if lab results were back, please advise. CB is [phone]

## 2020-03-10 ENCOUNTER — Other Ambulatory Visit: Payer: Self-pay

## 2020-03-10 DIAGNOSIS — E119 Type 2 diabetes mellitus without complications: Secondary | ICD-10-CM

## 2020-03-10 MED ORDER — METFORMIN HCL 500 MG PO TABS: 500.0000 mg | ORAL_TABLET | Freq: Two times a day (BID) | ORAL | 3 refills | Status: DC

## 2020-03-10 NOTE — Telephone Encounter (Signed)
Returned patients call no answer LM and also sent message via Mychart. Patient did view labs online.

## 2020-03-11 NOTE — Telephone Encounter (Signed)
Patient is calling back and has questions about her lab results. CB is [phone].

## 2020-03-11 NOTE — Telephone Encounter (Signed)
Spoke with patient who verbally understood lab results below. Per patient she will pick up Rx and start today

## 2020-03-15 ENCOUNTER — Other Ambulatory Visit: Payer: Self-pay | Admitting: Family Medicine

## 2020-03-22 ENCOUNTER — Other Ambulatory Visit: Payer: Self-pay | Admitting: Family Medicine

## 2020-03-31 ENCOUNTER — Other Ambulatory Visit: Payer: Self-pay | Admitting: Family Medicine

## 2020-05-18 ENCOUNTER — Encounter: Payer: Self-pay | Admitting: Family Medicine

## 2020-05-18 ENCOUNTER — Other Ambulatory Visit: Payer: Self-pay

## 2020-05-18 ENCOUNTER — Ambulatory Visit: Payer: BC Managed Care – PPO | Admitting: Family Medicine

## 2020-05-18 VITALS — BP 118/72 | HR 89 | Temp 97.9°F | Ht 64.5 in | Wt 228.4 lb

## 2020-05-18 DIAGNOSIS — R1032 Left lower quadrant pain: Secondary | ICD-10-CM | POA: Diagnosis not present

## 2020-05-18 LAB — POCT URINALYSIS DIPSTICK
Blood, UA: NEGATIVE
Glucose, UA: NEGATIVE
Ketones, UA: NEGATIVE
Leukocytes, UA: NEGATIVE
Nitrite, UA: NEGATIVE
Protein, UA: NEGATIVE
Spec Grav, UA: 1.02 (ref 1.010–1.025)
Urobilinogen, UA: NEGATIVE E.U./dL — AB
pH, UA: 6 (ref 5.0–8.0)

## 2020-05-18 MED ORDER — METRONIDAZOLE 500 MG PO TABS: 500.0000 mg | ORAL_TABLET | Freq: Three times a day (TID) | ORAL | 0 refills | Status: DC

## 2020-05-18 MED ORDER — CIPROFLOXACIN HCL 500 MG PO TABS: 500.0000 mg | ORAL_TABLET | Freq: Two times a day (BID) | ORAL | 0 refills | Status: DC

## 2020-05-18 NOTE — Addendum Note (Signed)
Addended by: Konrad Saha on: 05/18/2020 05:09 PM   Modules accepted: Orders

## 2020-05-18 NOTE — Progress Notes (Signed)
   Subjective:    Patient ID: Kari Lewis, female    DOB: 1965-05-16, 55 y.o.   MRN: [id number]  HPI Here for the sudden onset at 3 am this morning of an intermittent sharp pain in the LLQ of her abdomen. This does not move around. She has never felt this before. No fever or nausea. Her appetite is normal. No urinary symptoms. Her last BM was yesterday and was normal. Her colonoscopy in 202 showed left sided diverticulosis. She no longer has periods because she has a Mirena in place.   Review of Systems  Constitutional: Negative.   Respiratory: Negative.   Cardiovascular: Negative.   Gastrointestinal: Positive for abdominal pain. Negative for abdominal distention, anal bleeding, blood in stool, constipation, diarrhea, nausea, rectal pain and vomiting.  Genitourinary: Negative.        Objective:   Physical Exam Constitutional:      General: She is not in acute distress.    Appearance: Normal appearance.  Cardiovascular:     Rate and Rhythm: Normal rate and regular rhythm.     Pulses: Normal pulses.     Heart sounds: Normal heart sounds.  Pulmonary:     Effort: Pulmonary effort is normal.     Breath sounds: Normal breath sounds.  Abdominal:     General: Abdomen is flat. Bowel sounds are normal. There is no distension.     Palpations: Abdomen is soft. There is no mass.     Tenderness: There is no guarding or rebound.     Hernia: No hernia is present.     Comments: She is quite tender in the LLQ but she is tender in the LUQ and around the left flank as well   Neurological:     Mental Status: She is alert.           Assessment & Plan:  LLQ pain, likely due to diverticulitis. Treat with Cipro and Flagyl. Recheck prn. Alysia Penna, MD

## 2020-05-18 NOTE — Addendum Note (Signed)
Addended by: Konrad Saha on: 05/18/2020 05:05 PM   Modules accepted: Orders

## 2020-05-31 ENCOUNTER — Other Ambulatory Visit: Payer: Self-pay | Admitting: Family Medicine

## 2020-05-31 ENCOUNTER — Encounter: Payer: Self-pay | Admitting: Family Medicine

## 2020-05-31 ENCOUNTER — Telehealth (INDEPENDENT_AMBULATORY_CARE_PROVIDER_SITE_OTHER): Payer: BC Managed Care – PPO | Admitting: Family Medicine

## 2020-05-31 DIAGNOSIS — R531 Weakness: Secondary | ICD-10-CM | POA: Diagnosis not present

## 2020-05-31 DIAGNOSIS — E119 Type 2 diabetes mellitus without complications: Secondary | ICD-10-CM | POA: Diagnosis not present

## 2020-05-31 DIAGNOSIS — K5732 Diverticulitis of large intestine without perforation or abscess without bleeding: Secondary | ICD-10-CM | POA: Diagnosis not present

## 2020-05-31 NOTE — Progress Notes (Signed)
Subjective:    Patient ID: Kari Lewis, female    DOB: 11/05/1965, 55 y.o.   MRN: [id number]  HPI Virtual Visit via Video Note  I connected with the patient on 05/31/20 at  4:00 PM EST by a video enabled telemedicine application and verified that I am speaking with the correct person using two identifiers.  Location patient: home Location provider:work or home office Persons participating in the virtual visit: patient, provider  I discussed the limitations of evaluation and management by telemedicine and the availability of in person appointments. The patient expressed understanding and agreed to proceed.   HPI: Here for several issues. First we followed up on diverticulitis. We saw her on 05-25-20 for LLQ abdominal pain and we diagnosed diverticulitis. She has been taking Cipro and Flagyl, and she is now feeling much better. The pain is gone, no fever, and her appetite is returning. Her last BM was 2 days ago. However yesterday she did not feel well all day. She felt weak and she had 7 spells where she felt shaky ans she had tingling all over the body. She admits to not eating much food for several days before that. Today she has been eating normall, and she has not had any more of these spells. Of note in December we diagnosed her with type 2 diabetes, and she was started on Metformin 500 mg BID. She has decreased this down to once a day on her own. She does not check her glucoses at home.    ROS: See pertinent positives and negatives per HPI.  Past Medical History:  Diagnosis Date  . Allergy   . Anxiety   . Complication of anesthesia    nausea  . Hypertension   . Leukocytosis    chronic & benign  . Pneumonia    >10 yrs   . PONV (postoperative nausea and vomiting)   . PPD positive, treated    with INH for 6 months    Past Surgical History:  Procedure Laterality Date  . BIOPSY BREAST     benign 1996  . CHOLECYSTECTOMY N/A 02/24/2019   Procedure: LAPAROSCOPIC  CHOLECYSTECTOMY;  Surgeon: Clovis Riley, MD;  Location: [facility] OR;  Service: General;  Laterality: N/A;  . COLONOSCOPY  09/17/2018   per Dr. Laurence Spates, diverticulosis, no polyps, repeat in 10 yrs     Family History  Problem Relation Age of Onset  . Breast cancer Other   . Coronary artery disease Other   . Diabetes Other   . Hyperlipidemia Other   . Hypertension Other   . Lung cancer Other   . Prostate cancer Other   . Stroke Other      Current Outpatient Medications:  .  cholecalciferol (VITAMIN D3) 25 MCG (1000 UT) tablet, Take 1,000 Units by mouth at bedtime., Disp: , Rfl:  .  amLODipine (NORVASC) 10 MG tablet, TAKE 1 TABLET BY MOUTH EVERY DAY, Disp: 90 tablet, Rfl: 0 .  aspirin EC 81 MG tablet, Take 81 mg by mouth at bedtime., Disp: , Rfl:  .  Calcium Carbonate-Vit D-Min (CALCIUM 1200 PO), Take 1,200 mg by mouth at bedtime., Disp: , Rfl:  .  ciprofloxacin (CIPRO) 500 MG tablet, Take 1 tablet (500 mg total) by mouth 2 (two) times daily., Disp: 20 tablet, Rfl: 0 .  escitalopram (LEXAPRO) 10 MG tablet, TAKE 1 TABLET(10 MG) BY MOUTH DAILY, Disp: 90 tablet, Rfl: 3 .  hydrochlorothiazide (HYDRODIURIL) 25 MG tablet, TAKE 1 TABLET BY MOUTH DAILY,  Disp: 90 tablet, Rfl: 3 .  levonorgestrel (MIRENA) 20 MCG/24HR IUD, 1 each by Intrauterine route once., Disp: , Rfl:  .  Melatonin 10 MG SUBL, Place 10 mg under the tongue at bedtime. , Disp: , Rfl:  .  metFORMIN (GLUCOPHAGE) 500 MG tablet, Take 1 tablet (500 mg total) by mouth 2 (two) times daily with a meal., Disp: 180 tablet, Rfl: 3 .  metoprolol succinate (TOPROL-XL) 50 MG 24 hr tablet, TAKE 1 TABLET BY MOUTH EVERY DAY IMMEDIATELY FOLLOWING A MEAL, Disp: 90 tablet, Rfl: 3 .  metroNIDAZOLE (FLAGYL) 500 MG tablet, Take 1 tablet (500 mg total) by mouth 3 (three) times daily., Disp: 30 tablet, Rfl: 0 .  omeprazole (PRILOSEC) 40 MG capsule, TAKE 1 CAPSULE(40 MG) BY MOUTH DAILY, Disp: 30 capsule, Rfl: 3 .  potassium chloride (KLOR-CON) 10 MEQ  tablet, TAKE 1 TABLET BY MOUTH EVERY DAY, Disp: 90 tablet, Rfl: 3 .  tolterodine (DETROL) 1 MG tablet, Take 1 mg by mouth at bedtime., Disp: , Rfl:  .  venlafaxine XR (EFFEXOR-XR) 150 MG 24 hr capsule, TAKE 1 CAPSULE(150 MG) BY MOUTH DAILY, Disp: 90 capsule, Rfl: 3  EXAM:  VITALS per patient if applicable:  GENERAL: alert, oriented, appears well and in no acute distress  HEENT: atraumatic, conjunttiva clear, no obvious abnormalities on inspection of external nose and ears  NECK: normal movements of the head and neck  LUNGS: on inspection no signs of respiratory distress, breathing rate appears normal, no obvious gross SOB, gasping or wheezing  CV: no obvious cyanosis  MS: moves all visible extremities without noticeable abnormality  PSYCH/NEURO: pleasant and cooperative, no obvious depression or anxiety, speech and thought processing grossly intact  ASSESSMENT AND PLAN: She is recovering nicely from diverticulitis, and she will finish out the antibiotics. I believe the spells of weakness she experienced yesterday were likely due to hypoglycemia. Now that she is eating again, this has likely resolved. We will write for her to obtain a glucometer so she can follow her glucoses at home. She will come by the lab this week for another A1c. Alysia Penna, MD  Discussed the following assessment and plan:  Type 2 diabetes mellitus without complication, without long-term current use of insulin ([place])     I discussed the assessment and treatment plan with the patient. The patient was provided an opportunity to ask questions and all were answered. The patient agreed with the plan and demonstrated an understanding of the instructions.   The patient was advised to call back or seek an in-person evaluation if the symptoms worsen or if the condition fails to improve as anticipated.     Review of Systems     Objective:   Physical Exam        Assessment & Plan:

## 2020-06-02 ENCOUNTER — Telehealth: Payer: Self-pay | Admitting: Family Medicine

## 2020-06-02 NOTE — Telephone Encounter (Signed)
Left a message for pt to call the office back regarding questions on lab orders/ appointment

## 2020-06-02 NOTE — Telephone Encounter (Signed)
Pt is calling in needing to have labs done and would like to know which labs she is to have.  May I have lab orders?

## 2020-06-04 ENCOUNTER — Other Ambulatory Visit: Payer: Self-pay

## 2020-06-04 ENCOUNTER — Other Ambulatory Visit (INDEPENDENT_AMBULATORY_CARE_PROVIDER_SITE_OTHER): Payer: BC Managed Care – PPO

## 2020-06-04 ENCOUNTER — Encounter: Payer: Self-pay | Admitting: Family Medicine

## 2020-06-04 DIAGNOSIS — R739 Hyperglycemia, unspecified: Secondary | ICD-10-CM

## 2020-06-04 DIAGNOSIS — E119 Type 2 diabetes mellitus without complications: Secondary | ICD-10-CM | POA: Diagnosis not present

## 2020-06-04 LAB — HEMOGLOBIN A1C: Hgb A1c MFr Bld: 7 % — ABNORMAL HIGH (ref 4.6–6.5)

## 2020-06-04 NOTE — Telephone Encounter (Signed)
Pt has a lab appointment this morning

## 2020-06-07 NOTE — Telephone Encounter (Signed)
Yes she has mild type 2 diabetes. She should be taking the Metformin twice a day as prescribed. Recheck an A1c in 90 days

## 2020-06-15 ENCOUNTER — Other Ambulatory Visit: Payer: Self-pay | Admitting: Family Medicine

## 2020-07-30 IMAGING — CT CT NECK W/ CM
2 of 4 series · 5 of 14 positions shown, 6 images · IV contrast (iopamidol)
Comparison: None.

CLINICAL DATA: Neck mass, nonpulsatile.

EXAM:
CT NECK WITH CONTRAST
TECHNIQUE: Multidetector CT imaging of the neck was performed using the
standard protocol following the bolus administration of intravenous
contrast.
CONTRAST:  75mL [id number] IOPAMIDOL ([id number]) INJECTION 61%

[Series 3: neck · axial · 0.53mm/px · z∈[-250,-170]mm · 2 of 121 slices shown]
[im 41/121  bone]
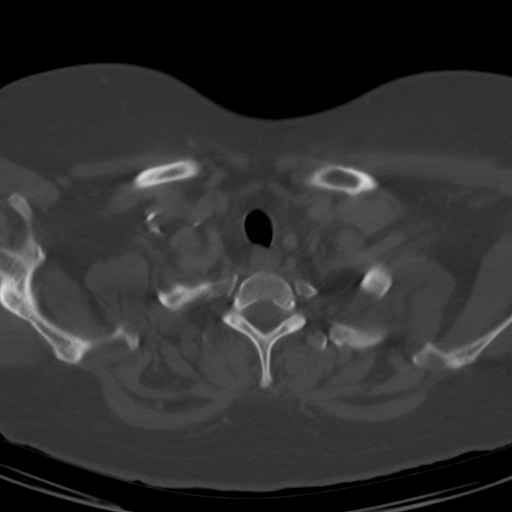
[im 81/121  bone]
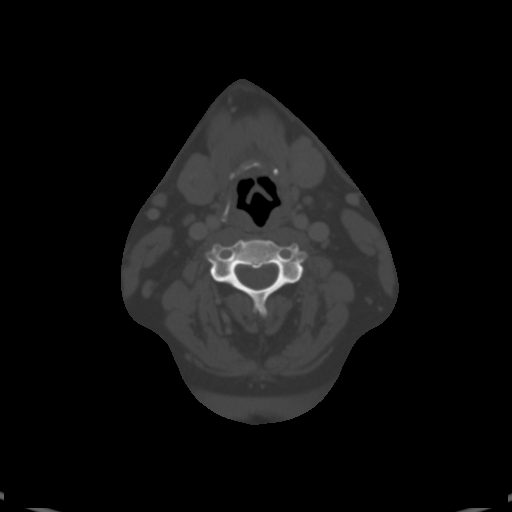

[Series 8: angled axial-oropharynx · axial · 0.52mm/px · z∈[-285,-165]mm · 3 of 122 slices shown, 4 images]
[im 31/122  soft-tissue]
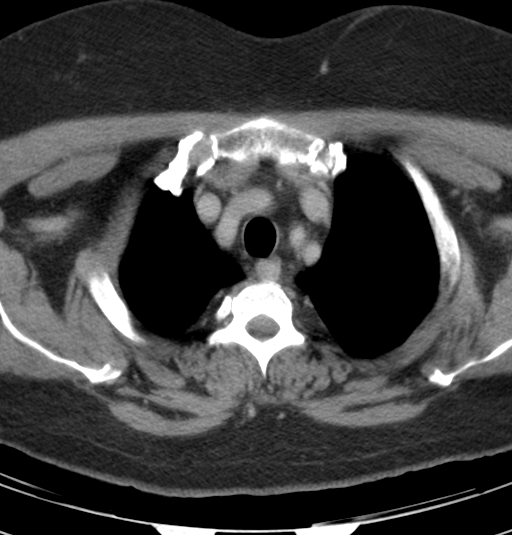
[im 31/122  bone]
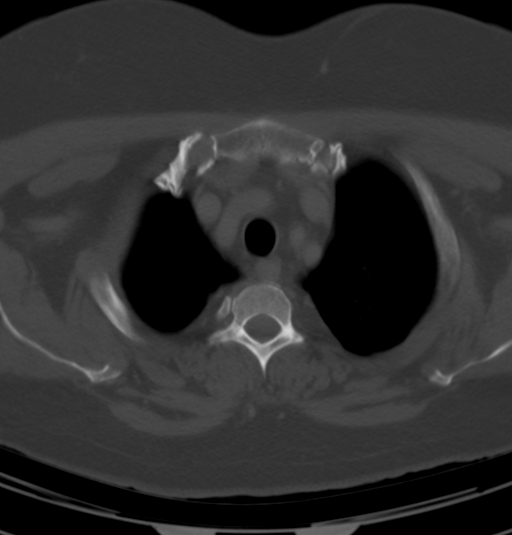
[im 61/122  bone]
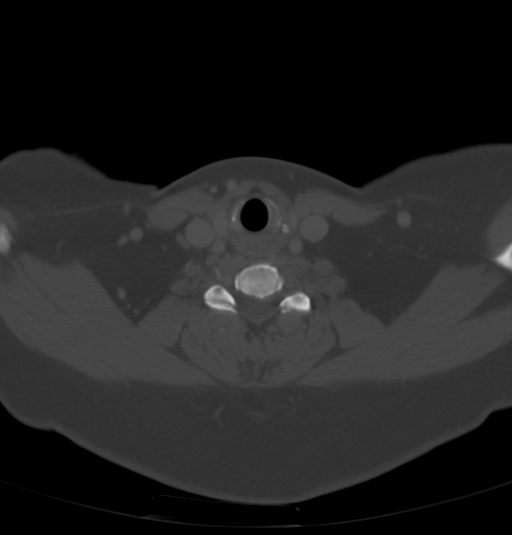
[im 91/122  bone]
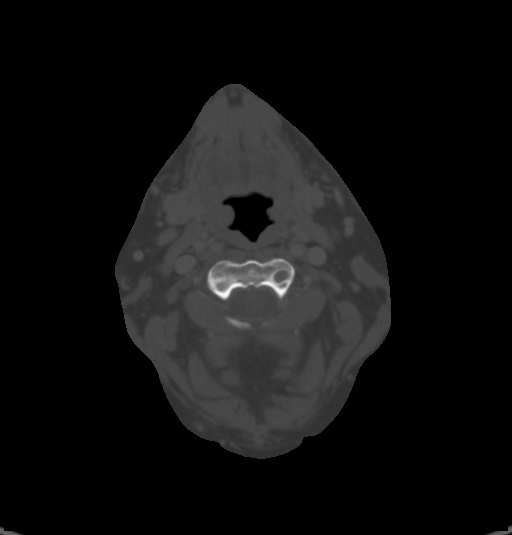

[5 of 14 positions shown; findings below may reference images not displayed]

FINDINGS: Pharynx and larynx: Adenoid tissue is more pronounced than expected
for age. Pharynx and larynx are otherwise unremarkable.

Salivary glands: No inflammation, mass, or stone.

Thyroid: Normal.

Lymph nodes: Multiple lymph nodes are seen on both sides of the
neck, including superficial to the left submandibular gland, at the
site of patient's complaint. However, the lymph nodes are within
normal size limits.

Vascular: Minimal calcified atherosclerotic plaque the descending
aorta and at the origin of the innominate artery.

Limited intracranial: Negative.

Mastoids and visualized paranasal sinuses: Clear.

Skeleton: No acute or aggressive process.

Upper chest: Negative.

Other: None.
IMPRESSION: 1. Multiple lymph nodes on both sides of the neck, including
superficial to the left submandibular gland, at the site of
patient's complaint. However, the lymph nodes are within normal size
limits.
2. Prominent adenoid tissue.

## 2020-08-16 ENCOUNTER — Other Ambulatory Visit: Payer: Self-pay | Admitting: Family Medicine

## 2020-09-11 ENCOUNTER — Other Ambulatory Visit: Payer: Self-pay | Admitting: Family Medicine

## 2020-10-03 ENCOUNTER — Other Ambulatory Visit: Payer: Self-pay | Admitting: Family Medicine

## 2020-10-20 ENCOUNTER — Encounter: Payer: Self-pay | Admitting: Family Medicine

## 2020-10-20 ENCOUNTER — Other Ambulatory Visit: Payer: Self-pay

## 2020-10-20 ENCOUNTER — Ambulatory Visit: Payer: BC Managed Care – PPO | Admitting: Family Medicine

## 2020-10-20 VITALS — BP 124/76 | HR 67 | Temp 98.8°F | Ht 64.5 in | Wt 237.2 lb

## 2020-10-20 DIAGNOSIS — R197 Diarrhea, unspecified: Secondary | ICD-10-CM | POA: Diagnosis not present

## 2020-10-20 DIAGNOSIS — M25471 Effusion, right ankle: Secondary | ICD-10-CM

## 2020-10-20 DIAGNOSIS — M25472 Effusion, left ankle: Secondary | ICD-10-CM

## 2020-10-20 DIAGNOSIS — I10 Essential (primary) hypertension: Secondary | ICD-10-CM | POA: Diagnosis not present

## 2020-10-20 NOTE — Progress Notes (Signed)
   Subjective:    Patient ID: Kari Lewis, female    DOB: 18-Aug-1965, 55 y.o.   MRN: [id number]  HPI Here for several issues. First she recently took a trip to [place] and she did a lot of hiking while there. Over the past week she noticed swelling in both lower legs and now there is a red rash on both lower legs. No chest pain or SOB. No recent medication changes. Also since getting her gall bladder removed she has has frequent loose stools and bloating. She often passes a lot of flatus. No abdominal pain.    Review of Systems  Constitutional: Negative.   Respiratory: Negative.    Cardiovascular:  Positive for leg swelling. Negative for chest pain and palpitations.  Gastrointestinal:  Positive for abdominal distention and diarrhea. Negative for abdominal pain, anal bleeding, blood in stool, constipation, nausea, rectal pain and vomiting.  Skin:  Positive for rash.      Objective:   Physical Exam Constitutional:      Appearance: Normal appearance. She is not ill-appearing.  Cardiovascular:     Rate and Rhythm: Normal rate and regular rhythm.     Pulses: Normal pulses.     Heart sounds: Normal heart sounds.  Pulmonary:     Effort: Pulmonary effort is normal.     Breath sounds: Normal breath sounds.  Abdominal:     General: Abdomen is flat. Bowel sounds are normal. There is no distension.     Palpations: Abdomen is soft. There is no mass.     Tenderness: There is no abdominal tenderness. There is no right CVA tenderness, left CVA tenderness, guarding or rebound.     Hernia: No hernia is present.  Musculoskeletal:     Comments: 2+ edema in both legs from the knees down to the ankles. No warmth or tenderness   Skin:    Comments: There are patches of macular tiny red spots around both medial ankles.   Neurological:     Mental Status: She is alert.          Assessment & Plan:  She has lower leg swelling which is likely due to a combination of Amlodipine side effects  and flying on a plane. I think this is temporary and I expect it to resolve over the next week or two. She can elevated her feet. If she flies again in the future I suggested she wear support stockings on the plane. Her HTN is well controlled. As for her diarrhea, she will try a probiotic like Align daily. We spent 35 minutes reviewing records and discussing these issues.  Alysia Penna, MD

## 2020-11-05 DIAGNOSIS — Z1231 Encounter for screening mammogram for malignant neoplasm of breast: Secondary | ICD-10-CM | POA: Diagnosis not present

## 2020-11-26 ENCOUNTER — Other Ambulatory Visit: Payer: Self-pay | Admitting: Obstetrics and Gynecology

## 2020-11-26 DIAGNOSIS — N3281 Overactive bladder: Secondary | ICD-10-CM | POA: Diagnosis not present

## 2020-11-26 DIAGNOSIS — Z9189 Other specified personal risk factors, not elsewhere classified: Secondary | ICD-10-CM

## 2020-11-26 DIAGNOSIS — L900 Lichen sclerosus et atrophicus: Secondary | ICD-10-CM | POA: Diagnosis not present

## 2020-11-26 DIAGNOSIS — Z6841 Body Mass Index (BMI) 40.0 and over, adult: Secondary | ICD-10-CM | POA: Diagnosis not present

## 2020-11-26 DIAGNOSIS — R8290 Unspecified abnormal findings in urine: Secondary | ICD-10-CM | POA: Diagnosis not present

## 2020-11-26 DIAGNOSIS — Z01419 Encounter for gynecological examination (general) (routine) without abnormal findings: Secondary | ICD-10-CM | POA: Diagnosis not present

## 2020-12-03 ENCOUNTER — Telehealth (INDEPENDENT_AMBULATORY_CARE_PROVIDER_SITE_OTHER): Payer: BC Managed Care – PPO | Admitting: Family Medicine

## 2020-12-03 ENCOUNTER — Encounter: Payer: Self-pay | Admitting: Family Medicine

## 2020-12-03 DIAGNOSIS — L900 Lichen sclerosus et atrophicus: Secondary | ICD-10-CM | POA: Diagnosis not present

## 2020-12-03 DIAGNOSIS — R109 Unspecified abdominal pain: Secondary | ICD-10-CM | POA: Diagnosis not present

## 2020-12-03 DIAGNOSIS — E663 Overweight: Secondary | ICD-10-CM | POA: Diagnosis not present

## 2020-12-03 DIAGNOSIS — N94819 Vulvodynia, unspecified: Secondary | ICD-10-CM | POA: Diagnosis not present

## 2020-12-03 DIAGNOSIS — N3281 Overactive bladder: Secondary | ICD-10-CM | POA: Diagnosis not present

## 2020-12-03 MED ORDER — PHENTERMINE HCL 37.5 MG PO CAPS: 37.5000 mg | ORAL_CAPSULE | ORAL | 1 refills | Status: DC

## 2020-12-03 NOTE — Progress Notes (Signed)
Subjective:    Patient ID: Kari Lewis, female    DOB: 1965/08/31, 55 y.o.   MRN: [id number]  HPI Virtual Visit via Video Note  I connected with the patient on 12/03/20 at 10:30 AM EDT by a video enabled telemedicine application and verified that I am speaking with the correct person using two identifiers.  Location patient: home Location provider:work or home office Persons participating in the virtual visit: patient, provider  I discussed the limitations of evaluation and management by telemedicine and the availability of in person appointments. The patient expressed understanding and agreed to proceed.   HPI: Her asking for help with weight loss. She is exercising and eating a healthy diet, but she wants to take an appetite suppressant for awhile.    ROS: See pertinent positives and negatives per HPI.  Past Medical History:  Diagnosis Date   Allergy    Anxiety    Complication of anesthesia    nausea   Hypertension    Leukocytosis    chronic & benign   Pneumonia    >10 yrs    PONV (postoperative nausea and vomiting)    PPD positive, treated    with INH for 6 months    Past Surgical History:  Procedure Laterality Date   BIOPSY BREAST     benign 1996   CHOLECYSTECTOMY N/A 02/24/2019   Procedure: LAPAROSCOPIC CHOLECYSTECTOMY;  Surgeon: Clovis Riley, MD;  Location: [place];  Service: General;  Laterality: N/A;   COLONOSCOPY  09/17/2018   per Dr. Laurence Spates, diverticulosis, no polyps, repeat in 10 yrs     Family History  Problem Relation Age of Onset   Breast cancer Other    Coronary artery disease Other    Diabetes Other    Hyperlipidemia Other    Hypertension Other    Lung cancer Other    Prostate cancer Other    Stroke Other      Current Outpatient Medications:    amLODipine (NORVASC) 10 MG tablet, TAKE 1 TABLET BY MOUTH EVERY DAY, Disp: 90 tablet, Rfl: 0   aspirin EC 81 MG tablet, Take 81 mg by mouth at bedtime., Disp: , Rfl:    Calcium  Carbonate-Vit D-Min (CALCIUM 1200 PO), Take 1,200 mg by mouth at bedtime., Disp: , Rfl:    cholecalciferol (VITAMIN D3) 25 MCG (1000 UT) tablet, Take 1,000 Units by mouth at bedtime., Disp: , Rfl:    escitalopram (LEXAPRO) 10 MG tablet, TAKE 1 TABLET(10 MG) BY MOUTH DAILY, Disp: 90 tablet, Rfl: 3   hydrochlorothiazide (HYDRODIURIL) 25 MG tablet, TAKE 1 TABLET BY MOUTH DAILY, Disp: 90 tablet, Rfl: 1   levonorgestrel (MIRENA) 20 MCG/24HR IUD, 1 each by Intrauterine route once., Disp: , Rfl:    Melatonin 10 MG SUBL, Place 10 mg under the tongue at bedtime. , Disp: , Rfl:    metFORMIN (GLUCOPHAGE) 500 MG tablet, Take 1 tablet (500 mg total) by mouth 2 (two) times daily with a meal., Disp: 180 tablet, Rfl: 3   metoprolol succinate (TOPROL-XL) 50 MG 24 hr tablet, TAKE 1 TABLET BY MOUTH EVERY DAY IMMEDIATELY FOLLOWING A MEAL, Disp: 90 tablet, Rfl: 3   metroNIDAZOLE (FLAGYL) 500 MG tablet, Take 1 tablet (500 mg total) by mouth 3 (three) times daily., Disp: 30 tablet, Rfl: 0   omeprazole (PRILOSEC) 40 MG capsule, TAKE 1 CAPSULE(40 MG) BY MOUTH DAILY, Disp: 30 capsule, Rfl: 3   phentermine 37.5 MG capsule, Take 1 capsule (37.5 mg total) by mouth every  morning., Disp: 90 capsule, Rfl: 1   potassium chloride (KLOR-CON) 10 MEQ tablet, TAKE 1 TABLET BY MOUTH EVERY DAY, Disp: 90 tablet, Rfl: 1   tolterodine (DETROL) 1 MG tablet, Take 1 mg by mouth at bedtime., Disp: , Rfl:    venlafaxine XR (EFFEXOR-XR) 150 MG 24 hr capsule, TAKE 1 CAPSULE(150 MG) BY MOUTH DAILY, Disp: 90 capsule, Rfl: 3  EXAM:  VITALS per patient if applicable:  GENERAL: alert, oriented, appears well and in no acute distress  HEENT: atraumatic, conjunttiva clear, no obvious abnormalities on inspection of external nose and ears  NECK: normal movements of the head and neck  LUNGS: on inspection no signs of respiratory distress, breathing rate appears normal, no obvious gross SOB, gasping or wheezing  CV: no obvious cyanosis  MS: moves  all visible extremities without noticeable abnormality  PSYCH/NEURO: pleasant and cooperative, no obvious depression or anxiety, speech and thought processing grossly intact  ASSESSMENT AND PLAN: Obesity, she will try Phentermine 37.5 mg daily for 6 months. I asked her to monitor her BP during this time. Alysia Penna, MD  Discussed the following assessment and plan:  No diagnosis found.     I discussed the assessment and treatment plan with the patient. The patient was provided an opportunity to ask questions and all were answered. The patient agreed with the plan and demonstrated an understanding of the instructions.   The patient was advised to call back or seek an in-person evaluation if the symptoms worsen or if the condition fails to improve as anticipated.      Review of Systems     Objective:   Physical Exam        Assessment & Plan:

## 2020-12-14 ENCOUNTER — Other Ambulatory Visit: Payer: Self-pay | Admitting: Family Medicine

## 2020-12-18 ENCOUNTER — Other Ambulatory Visit: Payer: Self-pay | Admitting: Family Medicine

## 2020-12-22 ENCOUNTER — Ambulatory Visit: Payer: BC Managed Care – PPO | Admitting: Physician Assistant

## 2020-12-22 ENCOUNTER — Encounter: Payer: Self-pay | Admitting: Physician Assistant

## 2020-12-22 ENCOUNTER — Other Ambulatory Visit: Payer: Self-pay

## 2020-12-22 DIAGNOSIS — L570 Actinic keratosis: Secondary | ICD-10-CM | POA: Diagnosis not present

## 2020-12-22 DIAGNOSIS — Z1283 Encounter for screening for malignant neoplasm of skin: Secondary | ICD-10-CM | POA: Diagnosis not present

## 2020-12-22 DIAGNOSIS — D485 Neoplasm of uncertain behavior of skin: Secondary | ICD-10-CM

## 2020-12-22 DIAGNOSIS — B078 Other viral warts: Secondary | ICD-10-CM | POA: Diagnosis not present

## 2020-12-22 NOTE — Patient Instructions (Signed)

## 2020-12-22 NOTE — Progress Notes (Signed)
   Follow-Up Visit   Subjective  Kari Lewis is a 55 y.o. female who presents for the following: Skin Problem (New lesions x few years- Right leg x 2, & right arm- growing. No personal history of melanoma or non mole skin cancers.).   The following portions of the chart were reviewed this encounter and updated as appropriate:  Tobacco  Allergies  Meds  Problems  Med Hx  Surg Hx  Fam Hx      Objective  Well appearing patient in no apparent distress; mood and affect are within normal limits.  A full examination was performed including scalp, head, eyes, ears, nose, lips, neck, chest, axillae, abdomen, back, buttocks, bilateral upper extremities, bilateral lower extremities, hands, feet, fingers, toes, fingernails, and toenails. All findings within normal limits unless otherwise noted below.  Right Thigh - Anterior Pink plaque       Right Forearm - Anterior (2) Erythematous patches with gritty scale.   Assessment & Plan  Neoplasm of uncertain behavior of skin Right Thigh - Anterior  Skin / nail biopsy Type of biopsy: tangential   Informed consent: discussed and consent obtained   Timeout: patient name, date of birth, surgical site, and procedure verified   Procedure prep:  Patient was prepped and draped in usual sterile fashion (Non sterile) Prep type:  Chlorhexidine Anesthesia: the lesion was anesthetized in a standard fashion   Anesthetic:  1% lidocaine w/ epinephrine 1-100,000 local infiltration Instrument used: flexible razor blade   Outcome: patient tolerated procedure well   Post-procedure details: wound care instructions given    Specimen 1 - Surgical pathology Differential Diagnosis: r/o sk  Check Margins: No  AK (actinic keratosis) (2) Right Forearm - Anterior  Destruction of lesion - Right Forearm - Anterior Complexity: simple   Destruction method: cryotherapy   Informed consent: discussed and consent obtained   Timeout:  patient name, date  of birth, surgical site, and procedure verified Lesion destroyed using liquid nitrogen: Yes   Cryotherapy cycles:  3 Outcome: patient tolerated procedure well with no complications      I, Jaysie Benthall, PA-C, have reviewed all documentation's for this visit.  The documentation on 12/22/20 for the exam, diagnosis, procedures and orders are all accurate and complete.

## 2021-01-05 DIAGNOSIS — N950 Postmenopausal bleeding: Secondary | ICD-10-CM | POA: Diagnosis not present

## 2021-01-29 ENCOUNTER — Other Ambulatory Visit: Payer: Self-pay | Admitting: Family Medicine

## 2021-02-10 ENCOUNTER — Other Ambulatory Visit: Payer: Self-pay | Admitting: Family Medicine

## 2021-02-11 ENCOUNTER — Ambulatory Visit: Payer: BC Managed Care – PPO | Admitting: Family Medicine

## 2021-02-11 ENCOUNTER — Encounter: Payer: Self-pay | Admitting: Family Medicine

## 2021-02-11 ENCOUNTER — Other Ambulatory Visit: Payer: Self-pay

## 2021-02-11 VITALS — BP 110/78 | HR 92 | Temp 98.2°F | Wt 224.0 lb

## 2021-02-11 DIAGNOSIS — E119 Type 2 diabetes mellitus without complications: Secondary | ICD-10-CM | POA: Diagnosis not present

## 2021-02-11 DIAGNOSIS — E663 Overweight: Secondary | ICD-10-CM | POA: Diagnosis not present

## 2021-02-11 DIAGNOSIS — R197 Diarrhea, unspecified: Secondary | ICD-10-CM

## 2021-02-11 LAB — HEMOGLOBIN A1C: Hgb A1c MFr Bld: 7 % — ABNORMAL HIGH (ref 4.6–6.5)

## 2021-02-11 NOTE — Progress Notes (Signed)
   Subjective:    Patient ID: Kari Lewis, female    DOB: 23-Apr-1965, 55 y.o.   MRN: [id number]  HPI Here to discuss diarrhea. She had her gall bladder removed about 2 years ago, and for the first year her stools were formed and unremarkable. Then about a year ago she developed loose stools that come multiple times a day, especially right after eating. Of note she started taking Metformin about a year ago when her A1c went up to 7.3. This was down to 7.0 in March. Also she started on Phentermine recently and she has been able to lose 13 lbs since July.    Review of Systems  Constitutional: Negative.   Respiratory: Negative.    Cardiovascular: Negative.   Gastrointestinal:  Positive for abdominal pain and diarrhea. Negative for abdominal distention, anal bleeding, blood in stool, constipation, nausea and vomiting.      Objective:   Physical Exam Constitutional:      Appearance: Normal appearance.  Cardiovascular:     Rate and Rhythm: Normal rate and regular rhythm.     Pulses: Normal pulses.     Heart sounds: Normal heart sounds.  Pulmonary:     Effort: Pulmonary effort is normal.     Breath sounds: Normal breath sounds.  Abdominal:     General: Abdomen is flat. Bowel sounds are normal. There is no distension.     Palpations: Abdomen is soft. There is no mass.     Tenderness: There is no abdominal tenderness. There is no guarding or rebound.     Hernia: No hernia is present.  Neurological:     Mental Status: She is alert.          Assessment & Plan:  She is slowly losing weight on Phentermine so she will continue on this. Her diarrhea is most likely a side effect of the Metformin, so she will stop taking this as of today. We will check an A1c today to help decide whether she will need an alternative diabetic medication to replace the Metformin.  Alysia Penna, MD

## 2021-02-11 NOTE — Addendum Note (Signed)
Addended by: Rosalyn Gess D on: 02/11/2021 12:17 PM   Modules accepted: Orders

## 2021-02-16 ENCOUNTER — Telehealth: Payer: Self-pay | Admitting: Family Medicine

## 2021-02-16 ENCOUNTER — Other Ambulatory Visit: Payer: Self-pay

## 2021-02-16 ENCOUNTER — Encounter: Payer: Self-pay | Admitting: Family Medicine

## 2021-02-16 ENCOUNTER — Telehealth (INDEPENDENT_AMBULATORY_CARE_PROVIDER_SITE_OTHER): Payer: BC Managed Care – PPO | Admitting: Family Medicine

## 2021-02-16 ENCOUNTER — Telehealth: Payer: Self-pay

## 2021-02-16 VITALS — Temp 98.1°F

## 2021-02-16 DIAGNOSIS — J029 Acute pharyngitis, unspecified: Secondary | ICD-10-CM

## 2021-02-16 MED ORDER — SITAGLIPTIN PHOSPHATE 50 MG PO TABS: 50.0000 mg | ORAL_TABLET | Freq: Every day | ORAL | 3 refills | Status: DC

## 2021-02-16 NOTE — Telephone Encounter (Signed)
Patient called asking for a call back no information given

## 2021-02-16 NOTE — Telephone Encounter (Signed)
Patient calling in with respiratory symptoms: Shortness of breath, chest pain, palpitations or other red words send to Triage  Does the patient have a fever over 100, cough, congestion, sore throat, runny nose, lost of taste/smell within the last 5 days (please symptoms that patient has) yes Have you tested for Covid in the last 5 days? No   If yes, was it positive []  OR negative [] ? If positive in the last 5 days, please schedule virtual visit now. If negative, schedule for an in person OV with the next available provider if PCP has no openings. Please also let patient know they will be tested again (follow the script below)  "you will have to arrive 35mins prior to your appt time to be Covid tested. Please park in back of office at the cone & call [phone] to let the staff know you have arrived. A staff member will meet you at your car to do a rapid covid test. Once the test has resulted you will be notified by phone of your results to determine if appt will remain an in person visit or be converted to a virtual/phone visit."  Pt is sch for virtual appt 02-16-2021 at 1030 am REMEMBER  If no availability for virtual visit in office,  please schedule another [place] office  If no availability at another [place] office, please instruct patient that they can schedule an evisit or virtual visit through their mychart account. Visits up to 8pm  patients can be seen in office 5 days after positive COVID test

## 2021-02-16 NOTE — Progress Notes (Signed)
Virtual Visit via Video Note  I connected with Kari Lewis on 02/17/21 at 10:30 AM EST by a video enabled telemedicine application and verified that I am speaking with the correct person using two identifiers.  Location patient: home Location provider: [place], [place] [address] Persons participating in the virtual visit: patient, provider  I discussed the limitations of evaluation and management by telemedicine and the availability of in person appointments. The patient expressed understanding and agreed to proceed.   Kari Lewis DOB: August 01, 1965 Encounter date: 02/16/2021  This is a 55 y.o. female who presents with Chief Complaint  Patient presents with   Sore Throat    X2 days   Eye Problem    Patient complains of crustiness noted bilateral eyes since this morning   Cough    Non-productive x2 days    History of present illness:  Sore throat only coming when she is coughing. Started coughing some phlegm up today. Hasn't had fevers. No chills or achiness. No trouble with swallowing. Eating and drinking ok.   Not short of breath, no wheezing.   Ears are stopped up - just clogged.   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 RSV going through school.   She did not get vaccinated for covid. Had covid about a year ago. Did ok with this.   Hasn't had flu shot this year.   No covid vaccinations; had covid infection last year.   Typical for her to be hoarse with illness.  She feels pretty well overall. Somewhat better today.  Eyes are a little red, crusty. Burning. Some goopy drainage. More like "sleep crust".   She is getting more drainage today.   Allergies  Allergen Reactions   Lisinopril Cough   Metformin And Related Diarrhea   Current Meds  Medication Sig   amLODipine (NORVASC) 10 MG tablet TAKE 1 TABLET BY MOUTH EVERY DAY   aspirin EC 81 MG tablet Take 81 mg by mouth at bedtime.   Calcium  Carbonate-Vit D-Min (CALCIUM 1200 PO) Take 1,200 mg by mouth at bedtime.   cholecalciferol (VITAMIN D3) 25 MCG (1000 UT) tablet Take 1,000 Units by mouth at bedtime.   escitalopram (LEXAPRO) 10 MG tablet TAKE 1 TABLET(10 MG) BY MOUTH DAILY   hydrochlorothiazide (HYDRODIURIL) 25 MG tablet TAKE 1 TABLET BY MOUTH DAILY   levonorgestrel (MIRENA) 20 MCG/24HR IUD 1 each by Intrauterine route once.   Melatonin 10 MG SUBL Place 10 mg under the tongue at bedtime.    metoprolol succinate (TOPROL-XL) 50 MG 24 hr tablet TAKE 1 TABLET BY MOUTH EVERY DAY IMMEDIATELY FOLLOWING A MEAL   omeprazole (PRILOSEC) 40 MG capsule TAKE 1 CAPSULE(40 MG) BY MOUTH DAILY   phentermine 37.5 MG capsule Take 1 capsule (37.5 mg total) by mouth every morning.   potassium chloride (KLOR-CON) 10 MEQ tablet TAKE 1 TABLET BY MOUTH EVERY DAY   tolterodine (DETROL) 1 MG tablet Take 1 mg by mouth at bedtime.   venlafaxine XR (EFFEXOR-XR) 150 MG 24 hr capsule TAKE 1 CAPSULE(150 MG) BY MOUTH DAILY    Review of Systems  Constitutional:  Positive for fatigue. Negative for chills and fever.  HENT:  Positive for congestion, postnasal drip and sore throat. Negative for ear pain (does have fullness), sinus pressure and sinus pain.   Respiratory:  Positive for cough. Negative for shortness of breath and wheezing.   Cardiovascular:  Negative for chest pain.   Objective:  Temp 98.1 F (36.7 C)       BP Readings from Last 3 Encounters:  02/11/21 110/78  10/20/20 124/76  05/18/20 118/72   Wt Readings from Last 3 Encounters:  02/11/21 224 lb (101.6 kg)  10/20/20 237 lb 3.2 oz (107.6 kg)  05/18/20 228 lb 6.4 oz (103.6 kg)    EXAM:  GENERAL: alert, oriented, appears well and in no acute distress  HEENT: atraumatic, conjunctiva clear, no obvious abnormalities on inspection of external nose and ears. Voice is hoarse.   NECK: normal movements of the head and neck  LUNGS: on inspection no signs of respiratory distress, breathing  rate appears normal, no obvious gross SOB, gasping or wheezing.   CV: no obvious cyanosis  MS: moves all visible extremities without noticeable abnormality  PSYCH/NEURO: pleasant and cooperative, no obvious depression or anxiety, speech and thought processing grossly intact   Assessment/Plan 1. Sore throat Suspect viral URI. Offered testing for covid/flu/rsv, but we do not have rapid covid tests. She is not feeling bad, more just congestion, ears plugged, laryngitis. Discussed warm fluids, voice rest, not straining to whisper, antihistamine/flonase to help with ear congestion, mucinex. Call if worsening of sx and we could car-side test her, but for now will treat symptomatically and suspect she will be feeling better by next week.    I discussed the assessment and treatment plan with the patient. The patient was provided an opportunity to ask questions and all were answered. The patient agreed with the plan and demonstrated an understanding of the instructions.   The patient was advised to call back or seek an in-person evaluation if the symptoms worsen or if the condition fails to improve as anticipated.  I provided 20 minutes of face-to-face time during this encounter.   Micheline Rough, MD

## 2021-02-16 NOTE — Telephone Encounter (Signed)
Patient called to follow up on her possibly having pink eye and wanting an appointment with Dr.Fry. I let patient know that it would have to be a virtual appointment due to her sore throat and cough that she was seen for earlier on a virtual with Dr.Koberlein. I asked if Dr.Koberlein and her spoke about her eyes and she stated they did briefly. Patient asked for a virtual with Dr.Fry to which I let her know there was no appointments available until Monday. She then asked for a message to be sent to [place] because she needs something before the weekend. I let patient know that I could send the message back and she would receive a call when they were available.     Good callback number is [phone]      Please advise

## 2021-02-17 MED ORDER — TOBRAMYCIN-DEXAMETHASONE 0.3-0.1 % OP SUSP: 2.0000 [drp] | OPHTHALMIC | 1 refills | Status: DC

## 2021-02-17 NOTE — Telephone Encounter (Signed)
Pt notified of eyedrops Rx via MyChart

## 2021-02-17 NOTE — Telephone Encounter (Signed)
Spoke with pt state that she had a video visit with Dr Livingston Diones for sore throat/cough and congestion pt states that she was told to get OTC Claritin which is not helping, pt states that she also thinks she has a pink eye, requests for antibiotic or any medication to relief her symptoms. Please advise

## 2021-02-17 NOTE — Telephone Encounter (Signed)
I sent in some eye drops to [facility]

## 2021-02-17 NOTE — Telephone Encounter (Signed)
Pt had video visit on 02/16/21.  Please advise

## 2021-02-23 ENCOUNTER — Other Ambulatory Visit: Payer: Self-pay | Admitting: Family Medicine

## 2021-03-15 ENCOUNTER — Telehealth: Payer: Self-pay | Admitting: Family Medicine

## 2021-03-15 ENCOUNTER — Other Ambulatory Visit: Payer: Self-pay | Admitting: Family Medicine

## 2021-03-15 NOTE — Telephone Encounter (Signed)
Prescription was sent to pharmacy on 02/16/21 for Januvia 50mg .   Reached out to pharmacy inquiring about prescription.   Pharmacy tech stated that at this time there is no form of generic Januvia, reverified twice.      Lvm informing patient of this information, and to call office with any questions.

## 2021-03-15 NOTE — Telephone Encounter (Signed)
Pt is calling and would like to know if she can get januvia 50 mg in generic form please sent to  [facility], [place] - [address] Phone:  [phone]  Fax:  [phone]

## 2021-03-16 DIAGNOSIS — J3489 Other specified disorders of nose and nasal sinuses: Secondary | ICD-10-CM | POA: Diagnosis not present

## 2021-03-16 DIAGNOSIS — H903 Sensorineural hearing loss, bilateral: Secondary | ICD-10-CM | POA: Diagnosis not present

## 2021-03-16 DIAGNOSIS — H6983 Other specified disorders of Eustachian tube, bilateral: Secondary | ICD-10-CM | POA: Diagnosis not present

## 2021-03-22 ENCOUNTER — Encounter: Payer: Self-pay | Admitting: Physician Assistant

## 2021-03-22 ENCOUNTER — Other Ambulatory Visit: Payer: Self-pay

## 2021-03-22 ENCOUNTER — Ambulatory Visit: Payer: BC Managed Care – PPO | Admitting: Physician Assistant

## 2021-03-22 ENCOUNTER — Other Ambulatory Visit: Payer: Self-pay | Admitting: Family Medicine

## 2021-03-22 DIAGNOSIS — L810 Postinflammatory hyperpigmentation: Secondary | ICD-10-CM

## 2021-03-22 DIAGNOSIS — L089 Local infection of the skin and subcutaneous tissue, unspecified: Secondary | ICD-10-CM

## 2021-03-22 DIAGNOSIS — T148XXA Other injury of unspecified body region, initial encounter: Secondary | ICD-10-CM | POA: Diagnosis not present

## 2021-03-22 DIAGNOSIS — S1189XA Other open wound of other specified part of neck, initial encounter: Secondary | ICD-10-CM | POA: Diagnosis not present

## 2021-03-22 DIAGNOSIS — L281 Prurigo nodularis: Secondary | ICD-10-CM

## 2021-03-22 MED ORDER — ALCLOMETASONE DIPROPIONATE 0.05 % EX CREA
TOPICAL_CREAM | Freq: Two times a day (BID) | CUTANEOUS | 1 refills | Status: DC
Start: 2021-03-22 — End: 2021-09-12

## 2021-03-22 MED ORDER — MUPIROCIN 2 % EX OINT
1.0000 | TOPICAL_OINTMENT | Freq: Every day | CUTANEOUS | 1 refills | Status: DC
Start: 2021-03-22 — End: 2021-09-12

## 2021-03-22 NOTE — Progress Notes (Signed)
° °  Follow-Up Visit   Subjective  Kari Lewis is a 55 y.o. female who presents for the following: Skin Problem (Patient has lesion on the chin x 1 month, itching, no bleeding. No personal or family history of atypical moles, melanoma, or non mole skin cancers. ).   The following portions of the chart were reviewed this encounter and updated as appropriate:  Tobacco   Allergies   Meds   Problems   Med Hx   Surg Hx   Fam Hx       Objective  Well appearing patient in no apparent distress; mood and affect are within normal limits.  A focused examination was performed including face and neck. Relevant physical exam findings are noted in the Assessment and Plan.  Left Submandibular Area, Mid Submental Area, Right Submandibular Area Numerous ulcerations with evidence of picking.   Left Submandibular Area, Left Submental Area, Right Submandibular Area Erythematous macules      Assessment & Plan  Prurigo nodularis (3) Left Submandibular Area; Right Submandibular Area; Mid Submental Area  mupirocin ointment (BACTROBAN) 2 % - Left Submandibular Area, Mid Submental Area, Right Submandibular Area Apply 1 application topically daily.  Local infection of skin and subcutaneous tissue  Related Procedures Anaerobic and Aerobic Culture  Non-healing non-surgical wound  Related Procedures Anaerobic and Aerobic Culture  Post-inflammatory hyperpigmentation (3) Left Submandibular Area; Right Submandibular Area; Left Submental Area  alclomethasone (ACLOVATE) 0.05 % cream - Left Submandibular Area, Left Submental Area, Right Submandibular Area Apply topically 2 (two) times daily.    I, Lanier Felty, PA-C, have reviewed all documentation's for this visit.  The documentation on 03/22/21 for the exam, diagnosis, procedures and orders are all accurate and complete.

## 2021-03-22 NOTE — Patient Instructions (Signed)
Dupilumab Injection What is this medication? DUPILUMAB (doo PIL ue mab) treats eczema, eosinophilic esophagitis, and sinus inflammation with nasal polyps. It decreases inflammation that contributes to these conditions. It is also used to prevent the symptoms of asthma. It works by decreasing inflammation of the airways, making it easier to breathe. Do not use it to treat a sudden asthma attack. This medicine may be used for other purposes; ask your health care provider or pharmacist if you have questions. COMMON BRAND NAME(S): DUPIXENT What should I tell my care team before I take this medication? They need to know if you have any of these conditions: Asthma Eye disease Parasitic (helminth) infection An unusual or allergic reaction to dupilumab, other medicines, foods, dyes, or preservatives Pregnant or trying to get pregnant Breast-feeding How should I use this medication? This medication is injected under the skin. You will be taught how to prepare and give it. Take it as directed on the prescription label. Keep taking it unless your care team tells you to stop. If you use a pen, be sure to take off the outer needle cover before using the dose. It is important that you put your used needles and syringes in a special sharps container. Do not put them in a trash can. If you do not have a sharps container, call your pharmacist or care team to get one. A patient package insert for the product will be given with each prescription and refill. Be sure to read this information carefully each time. The sheet may change often. This medication comes with INSTRUCTIONS FOR USE. Ask your pharmacist for directions on how to use this medication. Read the information carefully. Talk to your care team if you have questions. Talk to your care team about the use of this medication in children. While this medication may be prescribed for children as young as 6 years for selected conditions, precautions do  apply. Overdosage: If you think you have taken too much of this medicine contact a poison control center or emergency room at once. NOTE: This medicine is only for you. Do not share this medicine with others. What if I miss a dose? It is important not to miss any doses. Talk to your care team about what to do if you miss a dose. What may interact with this medication? Interactions are not expected. This list may not describe all possible interactions. Give your health care provider a list of all the medicines, herbs, non-prescription drugs, or dietary supplements you use. Also tell them if you smoke, drink alcohol, or use illegal drugs. Some items may interact with your medicine. What should I watch for while using this medication? Visit your care team for regular checks on your progress. Tell your care team if your symptoms do not start to get better or if they get worse. This medication can decrease the response to a vaccine. If you need to get vaccinated, tell your care team if you have received this medication. Talk to your care team to see if a different vaccination schedule is needed. If you take this medication for asthma, you and your care team should develop an Asthma Action Plan that is just for you. Be sure to know what to do if you are in the yellow (asthma is getting worse) or red (medical alert) zones. This medication is not used to treat sudden breathing problems. What side effects may I notice from receiving this medication? Side effects that you should report to your care team as  soon as possible: Allergic reactions--skin rash, itching, hives, swelling of the face, lips, tongue, or throat Severe joint pain Sudden eye pain or change in vision such as blurry vision Vasculitis--Unusual weakness or fatigue, fever, headache, skin rash, muscle or joint pain, loss of appetite, pain, tingling, or numbness in the hands or feet Side effects that usually do not require medical attention  (report these to your care team if they continue or are bothersome): Cold sore Dry eyes Joint pain Pain, redness, or irritation at injection site Sore throat Trouble sleeping This list may not describe all possible side effects. Call your doctor for medical advice about side effects. You may report side effects to FDA at 1-800-FDA-1088. Where should I keep my medication? Keep out of the reach of children and pets. Store in the refrigerator at 2 to 8 degrees C (36 to 46 degrees F). Do not freeze. Do not shake. Keep this medication in the original packaging until you are ready to take it. Protect from light. Get rid of any unused medication after the expiration date. This medication may be stored at room temperature up to 25 degrees C (77 degrees F) for up to 14 days. Keep this medication in the original packaging. Protect from light. If it is stored at room temperature, get rid of any unused medication after 14 days or after it expires, whichever is first. To get rid of medications that are no longer needed or have expired: Take the medication to a medication take-back program. Check with your pharmacy or law enforcement to find a location. If you cannot return the medication, ask your pharmacist or care team how to get rid of this medication safely. NOTE: This sheet is a summary. It may not cover all possible information. If you have questions about this medicine, talk to your doctor, pharmacist, or health care provider.  2022 Elsevier/Gold Standard (2020-08-31 00:00:00)

## 2021-03-23 ENCOUNTER — Ambulatory Visit: Payer: BC Managed Care – PPO | Admitting: Physician Assistant

## 2021-03-28 LAB — ANAEROBIC AND AEROBIC CULTURE
MICRO NUMBER:: 12779874
MICRO NUMBER:: 12779875
SPECIMEN QUALITY:: ADEQUATE
SPECIMEN QUALITY:: ADEQUATE

## 2021-04-06 ENCOUNTER — Telehealth: Payer: Self-pay

## 2021-04-06 NOTE — Telephone Encounter (Signed)
-----   Message from Warren Danes, [place] sent at 04/06/2021  8:09 AM EST ----- Check status. Antibiotic if nonhealing.

## 2021-04-06 NOTE — Telephone Encounter (Signed)
Patient aware of [place] response otc hydrocortisone bid and NO PICKING

## 2021-04-11 ENCOUNTER — Telehealth: Payer: Self-pay | Admitting: Family Medicine

## 2021-04-11 NOTE — Telephone Encounter (Signed)
Patient has tested positive for covid and wants to know if there is anything that can be sent in today. Patient is scheduled for a virtual appointment tomorrow     Please advise

## 2021-04-11 NOTE — Telephone Encounter (Signed)
Called patient, her symptoms started Friday she stated that if feels like she has a bad sinus infection.  Symptoms are no fever, no body aches, stuffy head, slight cough, which is not bad.   My chart video visit is scheduled for 04/12/21 at 10:30 am.    Please advise.

## 2021-04-11 NOTE — Telephone Encounter (Signed)
Spoke with patient, message given.  Voiced understanding.

## 2021-04-11 NOTE — Telephone Encounter (Signed)
Take Mucinex 1200 mg BID, take Tylenol as needed and drink fluids. We will see her tomorrow as scheduled

## 2021-04-12 ENCOUNTER — Other Ambulatory Visit: Payer: Self-pay

## 2021-04-12 ENCOUNTER — Encounter: Payer: Self-pay | Admitting: Family Medicine

## 2021-04-12 ENCOUNTER — Telehealth (INDEPENDENT_AMBULATORY_CARE_PROVIDER_SITE_OTHER): Payer: BC Managed Care – PPO | Admitting: Family Medicine

## 2021-04-12 DIAGNOSIS — U071 COVID-19: Secondary | ICD-10-CM | POA: Diagnosis not present

## 2021-04-12 MED ORDER — METHYLPREDNISOLONE 4 MG PO TBPK: ORAL_TABLET | ORAL | 0 refills | Status: DC

## 2021-04-12 NOTE — Progress Notes (Signed)
Subjective:    Patient ID: Kari Lewis, female    DOB: [birth year], 56 y.o.   MRN: [id number]  HPI Virtual Visit via Video Note  I connected with the patient on 04/12/21 at 10:30 AM EST by a video enabled telemedicine application and verified that I am speaking with the correct person using two identifiers.  Location patient: home Location provider:work or home office Persons participating in the virtual visit: patient, provider  I discussed the limitations of evaluation and management by telemedicine and the availability of in person appointments. The patient expressed understanding and agreed to proceed.   HPI: Here for a Covid19 infection. 4 days ago she developed sinus congestion and some PND. No fever or cough or SOB or NVD or body aches. She tested positive for the Covid virus 2 days ago.    ROS: See pertinent positives and negatives per HPI.  Past Medical History:  Diagnosis Date   Allergy    Anxiety    Complication of anesthesia    nausea   Hypertension    Leukocytosis    chronic & benign   Pneumonia    >10 yrs    PONV (postoperative nausea and vomiting)    PPD positive, treated    with INH for 6 months    Past Surgical History:  Procedure Laterality Date   BIOPSY BREAST     benign 1996   CHOLECYSTECTOMY N/A 02/24/2019   Procedure: LAPAROSCOPIC CHOLECYSTECTOMY;  Surgeon: Clovis Riley, MD;  Location: [place];  Service: General;  Laterality: N/A;   COLONOSCOPY  09/17/2018   per Dr. Laurence Spates, diverticulosis, no polyps, repeat in 10 yrs     Family History  Problem Relation Age of Onset   Breast cancer Other    Coronary artery disease Other    Diabetes Other    Hyperlipidemia Other    Hypertension Other    Lung cancer Other    Prostate cancer Other    Stroke Other      Current Outpatient Medications:    alclomethasone (ACLOVATE) 0.05 % cream, Apply topically 2 (two) times daily., Disp: 60 g, Rfl: 1   amLODipine (NORVASC) 10 MG tablet,  TAKE 1 TABLET BY MOUTH EVERY DAY, Disp: 90 tablet, Rfl: 0   aspirin EC 81 MG tablet, Take 81 mg by mouth at bedtime., Disp: , Rfl:    Calcium Carbonate-Vit D-Min (CALCIUM 1200 PO), Take 1,200 mg by mouth at bedtime., Disp: , Rfl:    cholecalciferol (VITAMIN D3) 25 MCG (1000 UT) tablet, Take 1,000 Units by mouth at bedtime., Disp: , Rfl:    escitalopram (LEXAPRO) 10 MG tablet, TAKE 1 TABLET(10 MG) BY MOUTH DAILY, Disp: 90 tablet, Rfl: 1   hydrochlorothiazide (HYDRODIURIL) 25 MG tablet, TAKE 1 TABLET BY MOUTH DAILY, Disp: 90 tablet, Rfl: 1   levonorgestrel (MIRENA) 20 MCG/24HR IUD, 1 each by Intrauterine route once., Disp: , Rfl:    Melatonin 10 MG SUBL, Place 10 mg under the tongue at bedtime. , Disp: , Rfl:    methylPREDNISolone (MEDROL DOSEPAK) 4 MG TBPK tablet, As directed, Disp: 21 tablet, Rfl: 0   metoprolol succinate (TOPROL-XL) 50 MG 24 hr tablet, TAKE 1 TABLET BY MOUTH EVERY DAY IMMEDIATELY FOLLOWING MEAL, Disp: 90 tablet, Rfl: 1   mupirocin ointment (BACTROBAN) 2 %, Apply 1 application topically daily., Disp: 22 g, Rfl: 1   omeprazole (PRILOSEC) 40 MG capsule, TAKE 1 CAPSULE(40 MG) BY MOUTH DAILY, Disp: 30 capsule, Rfl: 3   phentermine 37.5 MG  capsule, Take 1 capsule (37.5 mg total) by mouth every morning., Disp: 90 capsule, Rfl: 1   potassium chloride (KLOR-CON) 10 MEQ tablet, TAKE 1 TABLET BY MOUTH EVERY DAY, Disp: 90 tablet, Rfl: 0   sitaGLIPtin (JANUVIA) 50 MG tablet, Take 1 tablet (50 mg total) by mouth daily., Disp: 90 tablet, Rfl: 3   tobramycin-dexamethasone (TOBRADEX) ophthalmic solution, Place 2 drops into both eyes every 4 (four) hours while awake., Disp: 5 mL, Rfl: 1   tolterodine (DETROL) 1 MG tablet, Take 1 mg by mouth at bedtime., Disp: , Rfl:    venlafaxine XR (EFFEXOR-XR) 150 MG 24 hr capsule, TAKE 1 CAPSULE(150 MG) BY MOUTH DAILY, Disp: 90 capsule, Rfl: 0  EXAM:  VITALS per patient if applicable:  GENERAL: alert, oriented, appears well and in no acute  distress  HEENT: atraumatic, conjunttiva clear, no obvious abnormalities on inspection of external nose and ears  NECK: normal movements of the head and neck  LUNGS: on inspection no signs of respiratory distress, breathing rate appears normal, no obvious gross SOB, gasping or wheezing  CV: no obvious cyanosis  MS: moves all visible extremities without noticeable abnormality  PSYCH/NEURO: pleasant and cooperative, no obvious depression or anxiety, speech and thought processing grossly intact  ASSESSMENT AND PLAN: Covid-19 infection. We will treat her with a Medrol dose pack to relieve the congestion. Recheck as needed.  Alysia Penna, MD  Discussed the following assessment and plan:  No diagnosis found.     I discussed the assessment and treatment plan with the patient. The patient was provided an opportunity to ask questions and all were answered. The patient agreed with the plan and demonstrated an understanding of the instructions.   The patient was advised to call back or seek an in-person evaluation if the symptoms worsen or if the condition fails to improve as anticipated.      Review of Systems     Objective:   Physical Exam        Assessment & Plan:

## 2021-04-29 ENCOUNTER — Ambulatory Visit: Payer: BC Managed Care – PPO | Admitting: Family Medicine

## 2021-04-29 ENCOUNTER — Encounter: Payer: Self-pay | Admitting: Family Medicine

## 2021-04-29 VITALS — BP 98/70 | HR 74 | Temp 98.0°F | Wt 220.0 lb

## 2021-04-29 DIAGNOSIS — I10 Essential (primary) hypertension: Secondary | ICD-10-CM | POA: Diagnosis not present

## 2021-04-29 DIAGNOSIS — E119 Type 2 diabetes mellitus without complications: Secondary | ICD-10-CM | POA: Diagnosis not present

## 2021-04-29 NOTE — Progress Notes (Signed)
° °  Subjective:    Patient ID: Kari Lewis, female    DOB: Aug 08, 1965, 56 y.o.   MRN: [id number]  HPI Here to discuss her diabetes and HTN. She feels well. She does not check her BP or her glucoses. She has been watching her diet and she has lost 17 lbs since last July.    Review of Systems  Constitutional: Negative.   Respiratory: Negative.    Cardiovascular: Negative.       Objective:   Physical Exam Constitutional:      Appearance: Normal appearance.  Cardiovascular:     Rate and Rhythm: Normal rate and regular rhythm.     Pulses: Normal pulses.     Heart sounds: Normal heart sounds.  Pulmonary:     Effort: Pulmonary effort is normal.     Breath sounds: Normal breath sounds.  Neurological:     Mental Status: She is alert.          Assessment & Plan:  Her HTN is well controlled, and now she is on too much medication. We will stop the Metoprolol. I asked her to follow her BP at home once a week and report back to [place] in a month. As for the diabetes, she will return next month for another A1c.  Alysia Penna, MD

## 2021-04-30 ENCOUNTER — Other Ambulatory Visit: Payer: Self-pay | Admitting: Family Medicine

## 2021-05-03 NOTE — Telephone Encounter (Signed)
Pt is calling also and would like refill on methylprednisolone 4 mg due to left  ear still clogged. Pt want md to know the reason for request

## 2021-05-09 ENCOUNTER — Other Ambulatory Visit: Payer: Self-pay | Admitting: Family Medicine

## 2021-05-23 ENCOUNTER — Ambulatory Visit: Payer: BC Managed Care – PPO | Admitting: Family Medicine

## 2021-05-23 ENCOUNTER — Encounter: Payer: Self-pay | Admitting: Family Medicine

## 2021-05-23 VITALS — BP 110/78 | HR 96 | Temp 98.2°F | Wt 218.0 lb

## 2021-05-23 DIAGNOSIS — H6982 Other specified disorders of Eustachian tube, left ear: Secondary | ICD-10-CM | POA: Diagnosis not present

## 2021-05-23 DIAGNOSIS — E119 Type 2 diabetes mellitus without complications: Secondary | ICD-10-CM | POA: Diagnosis not present

## 2021-05-23 DIAGNOSIS — H6992 Unspecified Eustachian tube disorder, left ear: Secondary | ICD-10-CM | POA: Insufficient documentation

## 2021-05-23 MED ORDER — OZEMPIC (0.25 OR 0.5 MG/DOSE) 2 MG/1.5ML ~~LOC~~ SOPN: 0.5000 mg | PEN_INJECTOR | SUBCUTANEOUS | 5 refills | Status: DC

## 2021-05-23 NOTE — Progress Notes (Signed)
° °  Subjective:    Patient ID: Kari Lewis, female    DOB: Dec 05, 1965, 56 y.o.   MRN: [id number]  HPI Here for several issues. First she has had decreased hearing out of the left ear for about 4 months. No discomfort or dizziness. She saw a physician and an audiologist at [facility] in December for this. Her ear exam was normal, and she was found to have some sensorineural hearing loss on both sides. We felt she may have eustachian tube dysfunction so last month we treated her with a Medrol dose pack. This did not help. The other issue concerns her diabetes medication. She has been taking Januvia daily for a year or so, and her last A1c in November was 7.0. She asks if she can try Ozempic instead. Her friend has had good results in this and has been able to lose some weight.    Review of Systems  Constitutional: Negative.   HENT:  Positive for congestion and hearing loss. Negative for ear pain, postnasal drip and sore throat.   Eyes: Negative.   Respiratory: Negative.    Cardiovascular: Negative.       Objective:   Physical Exam Constitutional:      Appearance: Normal appearance.  HENT:     Right Ear: Tympanic membrane, ear canal and external ear normal.     Left Ear: Tympanic membrane, ear canal and external ear normal.     Nose: Nose normal.     Mouth/Throat:     Pharynx: Oropharynx is clear.  Eyes:     Conjunctiva/sclera: Conjunctivae normal.     Pupils: Pupils are equal, round, and reactive to light.  Cardiovascular:     Rate and Rhythm: Normal rate and regular rhythm.     Pulses: Normal pulses.     Heart sounds: Normal heart sounds.  Pulmonary:     Effort: Pulmonary effort is normal.     Breath sounds: Normal breath sounds.  Lymphadenopathy:     Cervical: No cervical adenopathy.  Neurological:     Mental Status: She is alert.          Assessment & Plan:  Her left sided hearing loss still appears to be due to eustachian tube dysfunction. She will start  using Flonase nasal spray daily (in the left nostril only because she has a hx of nose bleeds on the right side) and Mucinex 1200 mg BID. For his diabetes, we will stop Januvia and start her on Ozempic 0.5 mg weekly. Follow up in one month.  Alysia Penna, MD

## 2021-05-24 ENCOUNTER — Ambulatory Visit: Payer: BC Managed Care – PPO | Admitting: Physician Assistant

## 2021-05-26 ENCOUNTER — Ambulatory Visit: Payer: BC Managed Care – PPO | Admitting: Physician Assistant

## 2021-05-26 ENCOUNTER — Encounter: Payer: Self-pay | Admitting: Physician Assistant

## 2021-05-26 ENCOUNTER — Other Ambulatory Visit: Payer: Self-pay

## 2021-05-26 DIAGNOSIS — L739 Follicular disorder, unspecified: Secondary | ICD-10-CM

## 2021-05-26 MED ORDER — DOXYCYCLINE HYCLATE 100 MG PO CAPS: 100.0000 mg | ORAL_CAPSULE | Freq: Two times a day (BID) | ORAL | 0 refills | Status: AC

## 2021-05-31 ENCOUNTER — Other Ambulatory Visit: Payer: Self-pay | Admitting: Family Medicine

## 2021-06-07 ENCOUNTER — Telehealth: Payer: Self-pay | Admitting: Family Medicine

## 2021-06-07 NOTE — Telephone Encounter (Signed)
Pt is calling and would like to cancel the ozempic rx and would like  refill on januvia 50 mg. Pt will never januvia 50 mg by the end of this week  ?[place], [place] - [address] [place] [address] [place] [address] Phone:  [phone]  ?Fax:  [phone]  ?  ? ?

## 2021-06-07 NOTE — Telephone Encounter (Signed)
Januvia '50mg'$  not listed on medication list.   Please advise if okay for refill ?

## 2021-06-08 ENCOUNTER — Other Ambulatory Visit: Payer: Self-pay

## 2021-06-08 MED ORDER — SITAGLIPTIN PHOSPHATE 50 MG PO TABS: 50.0000 mg | ORAL_TABLET | Freq: Every day | ORAL | 3 refills | Status: AC

## 2021-06-08 NOTE — Telephone Encounter (Signed)
Cancel the Ozempic and call in Januvia 50 mg daily, #90 with 3 rf  ?

## 2021-06-08 NOTE — Telephone Encounter (Signed)
Pt Rx sent to her pharmacy per Dr Sarajane Jews, Rx for Ozempic was d/c ?

## 2021-06-09 ENCOUNTER — Other Ambulatory Visit: Payer: Self-pay | Admitting: Family Medicine

## 2021-06-10 ENCOUNTER — Other Ambulatory Visit: Payer: Self-pay | Admitting: Family Medicine

## 2021-06-10 ENCOUNTER — Telehealth: Payer: Self-pay

## 2021-06-10 NOTE — Telephone Encounter (Signed)
Pt Rx for Phentermine 37.5 mg was faxed to her pharmacy ?

## 2021-06-13 ENCOUNTER — Telehealth: Payer: Self-pay | Admitting: Family Medicine

## 2021-06-13 NOTE — Telephone Encounter (Signed)
Patient is calling and still waiting on rx phentermine 37.5 MG capsule the rx was printed and not e-scribe please resend  ?[place], [place] - [address] Phone:  [phone]  ?Fax:  [phone]  ?  ? ?

## 2021-06-14 ENCOUNTER — Encounter: Payer: Self-pay | Admitting: Physician Assistant

## 2021-06-14 MED ORDER — PHENTERMINE HCL 37.5 MG PO CAPS: ORAL_CAPSULE | ORAL | 0 refills | Status: DC

## 2021-06-14 NOTE — Progress Notes (Signed)
° °  Follow-Up Visit   Subjective  Kari Lewis is a 56 y.o. female who presents for the following: Follow-up (Neck/chin- some better- but still dry- tx- alclometasone & mupirocin  ).   The following portions of the chart were reviewed this encounter and updated as appropriate:  Tobacco  Allergies  Meds  Problems  Med Hx  Surg Hx  Fam Hx      Objective  Well appearing patient in no apparent distress; mood and affect are within normal limits.  A full examination was performed including scalp, head, eyes, ears, nose, lips, neck, chest, axillae, abdomen, back, buttocks, bilateral upper extremities, bilateral lower extremities, hands, feet, fingers, toes, fingernails, and toenails. All findings within normal limits unless otherwise noted below.  Mid Submental Area Perifollicular erythematous papules and pustules    Assessment & Plan  Folliculitis Mid Submental Area  doxycycline (VIBRAMYCIN) 100 MG capsule - Mid Submental Area Take 1 capsule (100 mg total) by mouth 2 (two) times daily.    I, Murry Khiev, PA-C, have reviewed all documentation's for this visit.  The documentation on 06/14/21 for the exam, diagnosis, procedures and orders are all accurate and complete.

## 2021-06-14 NOTE — Telephone Encounter (Signed)
Message complete.   Pharmacy will notify patient when ready for pickup. ?

## 2021-06-14 NOTE — Telephone Encounter (Signed)
Done

## 2021-06-16 NOTE — Telephone Encounter (Signed)
I initiated a PA for the pt in regards to her Phentermine HCL 37.5 mg capsules and this has been sent to her insurance for approval or denial. Key: [id number]  ?

## 2021-06-16 NOTE — Telephone Encounter (Signed)
I received a approval for the pt's Phentermine HCL 37.5 mg. Left a detailed message informing the pt that rx has been approved  ?

## 2021-06-29 LAB — HM DIABETES EYE EXAM

## 2021-07-29 ENCOUNTER — Encounter: Payer: Self-pay | Admitting: Family Medicine

## 2021-08-06 ENCOUNTER — Other Ambulatory Visit: Payer: Self-pay | Admitting: Family Medicine

## 2021-08-30 ENCOUNTER — Other Ambulatory Visit: Payer: Self-pay | Admitting: Family Medicine

## 2021-09-06 ENCOUNTER — Other Ambulatory Visit: Payer: Self-pay | Admitting: Family Medicine

## 2021-09-12 ENCOUNTER — Encounter: Payer: Self-pay | Admitting: Family Medicine

## 2021-09-12 ENCOUNTER — Ambulatory Visit: Payer: BC Managed Care – PPO | Admitting: Family Medicine

## 2021-09-12 VITALS — BP 110/80 | HR 100 | Temp 98.6°F | Ht 64.5 in | Wt 205.8 lb

## 2021-09-12 DIAGNOSIS — R5383 Other fatigue: Secondary | ICD-10-CM

## 2021-09-12 DIAGNOSIS — R61 Generalized hyperhidrosis: Secondary | ICD-10-CM

## 2021-09-12 DIAGNOSIS — R634 Abnormal weight loss: Secondary | ICD-10-CM | POA: Diagnosis not present

## 2021-09-12 DIAGNOSIS — T887XXA Unspecified adverse effect of drug or medicament, initial encounter: Secondary | ICD-10-CM

## 2021-09-12 DIAGNOSIS — E119 Type 2 diabetes mellitus without complications: Secondary | ICD-10-CM | POA: Diagnosis not present

## 2021-09-12 LAB — POCT GLYCOSYLATED HEMOGLOBIN (HGB A1C): Hemoglobin A1C: 6.6 % — AB (ref 4.0–5.6)

## 2021-09-12 NOTE — Progress Notes (Signed)
   Subjective:    Patient ID: Kari Lewis, female    DOB: January 23, 1966, 56 y.o.   MRN: [id number]  HPI Here to discuss one week of intermittent feelings of weakness, jitteriness, sweats, and hot flashes. No recent changes in medications. However we noted that she started taking Phentermine about 9 months ago to help lose weight, and she has been able to lose about 30 lbs since then. She was going to try Ozempic in February, but she changed her mind. She has been taking Januvia since then as usual. Her A1c today is 6.6%./    Review of Systems  Constitutional:  Positive for diaphoresis, fatigue and unexpected weight change. Negative for fever.  Respiratory: Negative.    Cardiovascular: Negative.   Gastrointestinal: Negative.   Genitourinary: Negative.        Objective:   Physical Exam Constitutional:      General: She is not in acute distress.    Appearance: Normal appearance.  Cardiovascular:     Rate and Rhythm: Regular rhythm. Tachycardia present.     Pulses: Normal pulses.     Heart sounds: Normal heart sounds.  Pulmonary:     Effort: Pulmonary effort is normal.     Breath sounds: Normal breath sounds.  Abdominal:     General: Abdomen is flat. Bowel sounds are normal. There is no distension.     Palpations: Abdomen is soft. There is no mass.     Tenderness: There is no abdominal tenderness. There is no rebound.     Hernia: No hernia is present.  Lymphadenopathy:     Cervical: No cervical adenopathy.  Neurological:     Mental Status: She is alert.           Assessment & Plan:  She has been experiencing symptoms that suggest medication side effects. I think the most likely culprit is the Phentermine, especially since she has lost significant weight while taking it. We agreed to stop this immediately. We will also screen with some labs today, including a thyroid panel.  Alysia Penna, MD

## 2021-09-12 NOTE — Progress Notes (Signed)
po

## 2021-09-13 ENCOUNTER — Ambulatory Visit: Payer: BC Managed Care – PPO | Admitting: Physician Assistant

## 2021-09-13 ENCOUNTER — Encounter: Payer: Self-pay | Admitting: Physician Assistant

## 2021-09-13 ENCOUNTER — Other Ambulatory Visit (INDEPENDENT_AMBULATORY_CARE_PROVIDER_SITE_OTHER): Payer: BC Managed Care – PPO

## 2021-09-13 DIAGNOSIS — L281 Prurigo nodularis: Secondary | ICD-10-CM | POA: Diagnosis not present

## 2021-09-13 DIAGNOSIS — R634 Abnormal weight loss: Secondary | ICD-10-CM | POA: Diagnosis not present

## 2021-09-13 LAB — CBC WITH DIFFERENTIAL/PLATELET
Basophils Absolute: 0 10*3/uL (ref 0.0–0.1)
Basophils Relative: 0.6 % (ref 0.0–3.0)
Eosinophils Absolute: 0.4 10*3/uL (ref 0.0–0.7)
Eosinophils Relative: 4.5 % (ref 0.0–5.0)
HCT: 42 % (ref 36.0–46.0)
Hemoglobin: 14.5 g/dL (ref 12.0–15.0)
Lymphocytes Relative: 42.7 % (ref 12.0–46.0)
Lymphs Abs: 3.6 10*3/uL (ref 0.7–4.0)
MCHC: 34.4 g/dL (ref 30.0–36.0)
MCV: 84 fl (ref 78.0–100.0)
Monocytes Absolute: 0.7 10*3/uL (ref 0.1–1.0)
Monocytes Relative: 8 % (ref 3.0–12.0)
Neutro Abs: 3.7 10*3/uL (ref 1.4–7.7)
Neutrophils Relative %: 44.2 % (ref 43.0–77.0)
Platelets: 281 10*3/uL (ref 150.0–400.0)
RBC: 5 Mil/uL (ref 3.87–5.11)
RDW: 13.1 % (ref 11.5–15.5)
WBC: 8.4 10*3/uL (ref 4.0–10.5)

## 2021-09-13 LAB — HEPATIC FUNCTION PANEL
ALT: 40 U/L — ABNORMAL HIGH (ref 0–35)
AST: 36 U/L (ref 0–37)
Albumin: 4.3 g/dL (ref 3.5–5.2)
Alkaline Phosphatase: 91 U/L (ref 39–117)
Bilirubin, Direct: 0.1 mg/dL (ref 0.0–0.3)
Total Bilirubin: 0.3 mg/dL (ref 0.2–1.2)
Total Protein: 7.3 g/dL (ref 6.0–8.3)

## 2021-09-13 LAB — BASIC METABOLIC PANEL
BUN: 10 mg/dL (ref 6–23)
CO2: 27 mEq/L (ref 19–32)
Calcium: 9.2 mg/dL (ref 8.4–10.5)
Chloride: 97 mEq/L (ref 96–112)
Creatinine, Ser: 0.71 mg/dL (ref 0.40–1.20)
GFR: 96 mL/min (ref >60.00)
Glucose, Bld: 128 mg/dL — ABNORMAL HIGH (ref 70–99)
Potassium: 3 mEq/L — ABNORMAL LOW (ref 3.5–5.1)
Sodium: 136 mEq/L (ref 135–145)

## 2021-09-13 LAB — TSH: TSH: 3.75 u[IU]/mL (ref 0.35–5.50)

## 2021-09-13 LAB — T3, FREE: T3, Free: 3.3 pg/mL (ref 2.3–4.2)

## 2021-09-13 LAB — T4, FREE: Free T4: 0.76 ng/dL (ref 0.60–1.60)

## 2021-09-13 MED ORDER — DUPIXENT 300 MG/2ML ~~LOC~~ SOAJ: 300.0000 mg | SUBCUTANEOUS | 3 refills | Status: AC

## 2021-09-13 MED ORDER — DUPIXENT 300 MG/2ML ~~LOC~~ SOAJ: 600.0000 mg | Freq: Once | SUBCUTANEOUS | 0 refills | Status: AC

## 2021-09-14 ENCOUNTER — Other Ambulatory Visit: Payer: Self-pay

## 2021-09-14 ENCOUNTER — Encounter: Payer: Self-pay | Admitting: Family Medicine

## 2021-09-14 DIAGNOSIS — E119 Type 2 diabetes mellitus without complications: Secondary | ICD-10-CM

## 2021-09-14 MED ORDER — POTASSIUM CHLORIDE CRYS ER 10 MEQ PO TBCR: 10.0000 meq | EXTENDED_RELEASE_TABLET | Freq: Two times a day (BID) | ORAL | 3 refills | Status: DC

## 2021-09-15 ENCOUNTER — Other Ambulatory Visit: Payer: Self-pay | Admitting: Family Medicine

## 2021-09-15 NOTE — Telephone Encounter (Signed)
She should take 2000 to 3000 mg daily

## 2021-09-19 ENCOUNTER — Encounter: Payer: Self-pay | Admitting: Physician Assistant

## 2021-09-19 NOTE — Progress Notes (Signed)
   Follow-Up Visit   Subjective  Kari Lewis is a 56 y.o. female who presents for the following: Skin Problem (Patient here today to have a lesion on her left thigh and right inner knee checked x unsure per patient no bleeding no pain. Patient would also like a lesion on her chin rechecked. ).   The following portions of the chart were reviewed this encounter and updated as appropriate:  Tobacco  Allergies  Meds  Problems  Med Hx  Surg Hx  Fam Hx      Objective  Well appearing patient in no apparent distress; mood and affect are within normal limits.  All skin waist up and legs were examined.  Left Thigh - Anterior, Right Knee - Anterior, Right Submandibular Area She has improved on antibiotic but is having break through sores that won't go away.  Discussed Dupixent and it's risks and benefits in depth.    Assessment & Plan  Prurigo nodularis Right Knee - Anterior; Left Thigh - Anterior; Right Submandibular Area  Dupixent new start   Dupilumab (DUPIXENT) 300 MG/2ML SOPN - Left Thigh - Anterior, Right Knee - Anterior, Right Submandibular Area Inject 300 mg into the skin every 14 (fourteen) days. Starting at day 15 for maintenance.  Dupilumab (DUPIXENT) 300 MG/2ML SOPN - Left Thigh - Anterior, Right Knee - Anterior, Right Submandibular Area Inject 600 mg into the skin once for 1 dose. On day 1.    I, Johnryan Sao, PA-C, have reviewed all documentation's for this visit.  The documentation on 09/19/21 for the exam, diagnosis, procedures and orders are all accurate and complete.

## 2021-09-20 ENCOUNTER — Telehealth: Payer: Self-pay | Admitting: *Deleted

## 2021-09-20 NOTE — Telephone Encounter (Signed)
Fax from [place] needing office notes and dx code- sent office notes and code via [employer] portal.

## 2021-09-20 NOTE — Telephone Encounter (Signed)
Patient's dupixent enrollment form faxed to [facility] @ [phone].

## 2021-09-26 ENCOUNTER — Other Ambulatory Visit: Payer: Self-pay | Admitting: Family Medicine

## 2021-09-26 ENCOUNTER — Telehealth: Payer: Self-pay

## 2021-09-27 ENCOUNTER — Telehealth: Payer: Self-pay | Admitting: Family Medicine

## 2021-09-27 NOTE — Telephone Encounter (Signed)
Pt is calling and would like a callback with a list of her current medication. Pt is aware md out of office this week

## 2021-10-05 NOTE — Telephone Encounter (Signed)
Patient aware of pharmacey change per [facility] [phone] Fax [phone] Also phone [phone] Patient will call for shipment

## 2021-10-24 ENCOUNTER — Telehealth: Payer: Self-pay

## 2021-10-24 NOTE — Telephone Encounter (Signed)
Fax received from [facility] stating they need the patient's prior authorization for the patient's Dupixent. Patient's information faxed to [facility] [employer] [facility] @ [phone].

## 2021-11-01 NOTE — Telephone Encounter (Signed)
Insurance waiting on her to answer the phone to be able to ship medication [phone] copay 35$. Message was left for patient to call

## 2021-11-14 DIAGNOSIS — Z1231 Encounter for screening mammogram for malignant neoplasm of breast: Secondary | ICD-10-CM | POA: Diagnosis not present

## 2021-11-14 LAB — HM MAMMOGRAPHY

## 2021-11-16 ENCOUNTER — Encounter: Payer: Self-pay | Admitting: Family Medicine

## 2021-11-17 MED ORDER — PHENTERMINE HCL 37.5 MG PO CAPS: 37.5000 mg | ORAL_CAPSULE | ORAL | 1 refills | Status: AC

## 2021-11-17 NOTE — Telephone Encounter (Signed)
Done

## 2021-11-23 ENCOUNTER — Encounter: Payer: Self-pay | Admitting: Family Medicine

## 2021-12-02 DIAGNOSIS — N6001 Solitary cyst of right breast: Secondary | ICD-10-CM | POA: Diagnosis not present

## 2021-12-02 DIAGNOSIS — R922 Inconclusive mammogram: Secondary | ICD-10-CM | POA: Diagnosis not present

## 2021-12-07 ENCOUNTER — Other Ambulatory Visit: Payer: Self-pay | Admitting: Family Medicine

## 2022-01-10 ENCOUNTER — Ambulatory Visit: Payer: BC Managed Care – PPO | Admitting: Physician Assistant

## 2022-01-10 DIAGNOSIS — L259 Unspecified contact dermatitis, unspecified cause: Secondary | ICD-10-CM | POA: Diagnosis not present

## 2022-01-10 DIAGNOSIS — Z124 Encounter for screening for malignant neoplasm of cervix: Secondary | ICD-10-CM | POA: Diagnosis not present

## 2022-01-10 DIAGNOSIS — N951 Menopausal and female climacteric states: Secondary | ICD-10-CM | POA: Diagnosis not present

## 2022-01-10 DIAGNOSIS — Z01419 Encounter for gynecological examination (general) (routine) without abnormal findings: Secondary | ICD-10-CM | POA: Diagnosis not present

## 2022-01-10 DIAGNOSIS — Z975 Presence of (intrauterine) contraceptive device: Secondary | ICD-10-CM | POA: Diagnosis not present

## 2022-01-25 ENCOUNTER — Other Ambulatory Visit: Payer: Self-pay | Admitting: Family Medicine

## 2022-02-14 ENCOUNTER — Encounter: Payer: Self-pay | Admitting: Family Medicine

## 2022-02-14 ENCOUNTER — Ambulatory Visit: Payer: BC Managed Care – PPO | Admitting: Family Medicine

## 2022-02-14 VITALS — BP 120/78 | HR 106 | Temp 97.9°F | Wt 214.0 lb

## 2022-02-14 DIAGNOSIS — R221 Localized swelling, mass and lump, neck: Secondary | ICD-10-CM | POA: Diagnosis not present

## 2022-02-14 DIAGNOSIS — R051 Acute cough: Secondary | ICD-10-CM | POA: Diagnosis not present

## 2022-02-14 DIAGNOSIS — E663 Overweight: Secondary | ICD-10-CM | POA: Diagnosis not present

## 2022-02-14 DIAGNOSIS — E119 Type 2 diabetes mellitus without complications: Secondary | ICD-10-CM | POA: Diagnosis not present

## 2022-02-14 MED ORDER — SEMAGLUTIDE(0.25 OR 0.5MG/DOS) 2 MG/3ML ~~LOC~~ SOPN: 0.2500 mg | PEN_INJECTOR | SUBCUTANEOUS | 0 refills | Status: DC

## 2022-02-14 NOTE — Progress Notes (Signed)
   Subjective:    Patient ID: Kari Lewis, female    DOB: 02-Mar-1966, 56 y.o.   MRN: [id number]  HPI Here to ask about trying Ozempic. She has been exercising and using Phentermine, but she has been unable to lose any weight. Her last A1c on 09-12-21 was 6.6%. Also she says she had a dry cough for a day after she mowed up leaves on the grass last week. Now she is fine. She deneis any fever or SOB. Lastly she felt a lump in the left side of her neck as few days ago and she wants [place] to check it. No ST or trouble swallowing.    Review of Systems  Constitutional: Negative.   Respiratory:  Positive for cough. Negative for shortness of breath and wheezing.   Cardiovascular: Negative.        Objective:   Physical Exam Constitutional:      Appearance: Normal appearance.  Neck:     Comments: She has a firm mobile non-tender lump in the left anterior neck, and she has a matching lump on the right side  Cardiovascular:     Rate and Rhythm: Normal rate and regular rhythm.     Pulses: Normal pulses.     Heart sounds: Normal heart sounds.  Pulmonary:     Effort: Pulmonary effort is normal.     Breath sounds: Normal breath sounds.  Musculoskeletal:     Cervical back: No tenderness.  Lymphadenopathy:     Cervical: No cervical adenopathy.  Neurological:     Mental Status: She is alert.           Assessment & Plan:  For the diabetes and obesity, she will try Ozempic. If this is successful we plan to stop the Phentermine and the Januvia. Second she had some allergy symptoms when she was coughing. No treatment is needed. Lastly the lump she feels in her neck is the hyoid bone, and I explained its purpose.

## 2022-02-22 ENCOUNTER — Other Ambulatory Visit: Payer: Self-pay | Admitting: Family Medicine

## 2022-03-01 ENCOUNTER — Telehealth: Payer: Self-pay | Admitting: Family Medicine

## 2022-03-01 NOTE — Telephone Encounter (Signed)
Pt called to inform MD that BC/BS is requesting authorization for the Ozempic.  Fax:  [phone]

## 2022-03-06 NOTE — Telephone Encounter (Signed)
PA foe Ozempic sent to plan

## 2022-03-07 ENCOUNTER — Other Ambulatory Visit: Payer: Self-pay | Admitting: Family Medicine

## 2022-03-08 ENCOUNTER — Encounter: Payer: Self-pay | Admitting: Family Medicine

## 2022-03-16 ENCOUNTER — Other Ambulatory Visit (HOSPITAL_COMMUNITY): Payer: Self-pay

## 2022-03-16 NOTE — Telephone Encounter (Signed)
Patient Advocate Encounter   Received notification from [facility] that prior authorization for [facility] is required.   PA submitted on 03/16/2022 Key [id number] Status is pending        Joneen Boers, [place] Patient Advocate Specialist [place] Patient Advocate Team Direct Number: [phone] Fax: [phone]

## 2022-03-17 NOTE — Telephone Encounter (Signed)
See 03/01/22 telephone note.

## 2022-03-17 NOTE — Telephone Encounter (Signed)
Please re-submit the PA but add that she cannot tolerate Metformin due to severe diarrhea

## 2022-03-17 NOTE — Telephone Encounter (Signed)
Noted  

## 2022-03-20 ENCOUNTER — Other Ambulatory Visit (HOSPITAL_COMMUNITY): Payer: Self-pay

## 2022-03-20 ENCOUNTER — Telehealth: Payer: Self-pay

## 2022-03-20 NOTE — Telephone Encounter (Signed)
Patient Advocate Encounter  Prior Authorization for Ozempic '2mg'$ /38m has been approved.    [place] [facility] [place] [place]. She processed the claim and copay came back as $24.99  Effective dates: 03/18/2022 through 03/19/2023  Key: [id number]

## 2022-03-20 NOTE — Telephone Encounter (Signed)
Patient notified of approval via 03/08/22 my chart message.    See 03/20/22 telephone note for approval message.

## 2022-03-20 NOTE — Telephone Encounter (Addendum)
Patient notified of approval via My Chart message on 03/08/22

## 2022-04-18 ENCOUNTER — Encounter: Payer: Self-pay | Admitting: Family Medicine

## 2022-04-19 MED ORDER — SEMAGLUTIDE(0.25 OR 0.5MG/DOS) 2 MG/1.5ML ~~LOC~~ SOPN: 0.5000 mg | PEN_INJECTOR | SUBCUTANEOUS | 2 refills | Status: DC

## 2022-04-19 NOTE — Telephone Encounter (Signed)
Done

## 2022-05-18 MED ORDER — SEMAGLUTIDE (1 MG/DOSE) 4 MG/3ML ~~LOC~~ SOPN: 1.0000 mg | PEN_INJECTOR | SUBCUTANEOUS | 2 refills | Status: DC

## 2022-05-18 NOTE — Telephone Encounter (Signed)
Done

## 2022-05-27 ENCOUNTER — Other Ambulatory Visit: Payer: Self-pay | Admitting: Family Medicine

## 2022-05-30 ENCOUNTER — Encounter: Payer: Self-pay | Admitting: Family Medicine

## 2022-05-30 ENCOUNTER — Other Ambulatory Visit: Payer: Self-pay

## 2022-05-30 DIAGNOSIS — K219 Gastro-esophageal reflux disease without esophagitis: Secondary | ICD-10-CM

## 2022-05-30 MED ORDER — OMEPRAZOLE 40 MG PO CPDR: DELAYED_RELEASE_CAPSULE | ORAL | 3 refills | Status: DC

## 2022-06-08 ENCOUNTER — Other Ambulatory Visit: Payer: Self-pay | Admitting: Family Medicine

## 2022-06-09 ENCOUNTER — Encounter: Payer: Self-pay | Admitting: Family Medicine

## 2022-06-09 MED ORDER — SEMAGLUTIDE (2 MG/DOSE) 8 MG/3ML ~~LOC~~ SOPN: 2.0000 mg | PEN_INJECTOR | SUBCUTANEOUS | 5 refills | Status: DC

## 2022-06-09 NOTE — Telephone Encounter (Signed)
Done

## 2022-08-17 ENCOUNTER — Other Ambulatory Visit: Payer: Self-pay | Admitting: Family Medicine

## 2022-09-04 ENCOUNTER — Other Ambulatory Visit: Payer: Self-pay | Admitting: Family Medicine

## 2022-09-23 ENCOUNTER — Other Ambulatory Visit: Payer: Self-pay | Admitting: Family Medicine

## 2022-09-23 DIAGNOSIS — K219 Gastro-esophageal reflux disease without esophagitis: Secondary | ICD-10-CM

## 2022-10-20 ENCOUNTER — Other Ambulatory Visit: Payer: Self-pay | Admitting: Family Medicine

## 2022-10-20 DIAGNOSIS — E119 Type 2 diabetes mellitus without complications: Secondary | ICD-10-CM

## 2022-11-27 DIAGNOSIS — K219 Gastro-esophageal reflux disease without esophagitis: Secondary | ICD-10-CM | POA: Diagnosis not present

## 2022-11-27 DIAGNOSIS — Z1331 Encounter for screening for depression: Secondary | ICD-10-CM | POA: Diagnosis not present

## 2022-11-27 DIAGNOSIS — I10 Essential (primary) hypertension: Secondary | ICD-10-CM | POA: Diagnosis not present

## 2022-11-27 DIAGNOSIS — E119 Type 2 diabetes mellitus without complications: Secondary | ICD-10-CM | POA: Diagnosis not present

## 2022-11-27 DIAGNOSIS — R252 Cramp and spasm: Secondary | ICD-10-CM | POA: Diagnosis not present

## 2022-12-02 ENCOUNTER — Other Ambulatory Visit: Payer: Self-pay | Admitting: Family Medicine

## 2023-01-05 DIAGNOSIS — M7989 Other specified soft tissue disorders: Secondary | ICD-10-CM | POA: Diagnosis not present

## 2023-01-05 DIAGNOSIS — M79605 Pain in left leg: Secondary | ICD-10-CM | POA: Diagnosis not present

## 2023-01-12 DIAGNOSIS — N951 Menopausal and female climacteric states: Secondary | ICD-10-CM | POA: Diagnosis not present

## 2023-01-12 DIAGNOSIS — Z01419 Encounter for gynecological examination (general) (routine) without abnormal findings: Secondary | ICD-10-CM | POA: Diagnosis not present

## 2023-01-12 DIAGNOSIS — R92333 Mammographic heterogeneous density, bilateral breasts: Secondary | ICD-10-CM | POA: Diagnosis not present

## 2023-01-12 DIAGNOSIS — Z975 Presence of (intrauterine) contraceptive device: Secondary | ICD-10-CM | POA: Diagnosis not present

## 2023-01-12 DIAGNOSIS — Z1231 Encounter for screening mammogram for malignant neoplasm of breast: Secondary | ICD-10-CM | POA: Diagnosis not present

## 2023-01-20 ENCOUNTER — Other Ambulatory Visit: Payer: Self-pay | Admitting: Family Medicine

## 2023-01-20 DIAGNOSIS — E119 Type 2 diabetes mellitus without complications: Secondary | ICD-10-CM

## 2023-01-24 ENCOUNTER — Other Ambulatory Visit: Payer: Self-pay | Admitting: Family Medicine

## 2023-01-24 DIAGNOSIS — K219 Gastro-esophageal reflux disease without esophagitis: Secondary | ICD-10-CM

## 2023-02-02 DIAGNOSIS — M5416 Radiculopathy, lumbar region: Secondary | ICD-10-CM | POA: Diagnosis not present

## 2023-02-02 DIAGNOSIS — M79605 Pain in left leg: Secondary | ICD-10-CM | POA: Diagnosis not present

## 2023-02-02 DIAGNOSIS — M1612 Unilateral primary osteoarthritis, left hip: Secondary | ICD-10-CM | POA: Diagnosis not present

## 2023-02-19 DIAGNOSIS — M79605 Pain in left leg: Secondary | ICD-10-CM | POA: Diagnosis not present

## 2023-02-21 DIAGNOSIS — M79605 Pain in left leg: Secondary | ICD-10-CM | POA: Diagnosis not present

## 2023-02-28 DIAGNOSIS — M79605 Pain in left leg: Secondary | ICD-10-CM | POA: Diagnosis not present

## 2023-03-05 DIAGNOSIS — M79605 Pain in left leg: Secondary | ICD-10-CM | POA: Diagnosis not present

## 2023-03-07 ENCOUNTER — Other Ambulatory Visit: Payer: Self-pay | Admitting: Family Medicine

## 2023-03-16 ENCOUNTER — Other Ambulatory Visit (HOSPITAL_COMMUNITY): Payer: Self-pay

## 2023-03-20 DIAGNOSIS — H7292 Unspecified perforation of tympanic membrane, left ear: Secondary | ICD-10-CM | POA: Diagnosis not present

## 2023-03-20 DIAGNOSIS — I10 Essential (primary) hypertension: Secondary | ICD-10-CM | POA: Diagnosis not present

## 2023-03-20 DIAGNOSIS — E119 Type 2 diabetes mellitus without complications: Secondary | ICD-10-CM | POA: Diagnosis not present

## 2023-03-20 DIAGNOSIS — H6693 Otitis media, unspecified, bilateral: Secondary | ICD-10-CM | POA: Diagnosis not present

## 2023-05-27 ENCOUNTER — Other Ambulatory Visit: Payer: Self-pay | Admitting: Family Medicine

## 2023-05-27 DIAGNOSIS — K219 Gastro-esophageal reflux disease without esophagitis: Secondary | ICD-10-CM

## 2023-06-06 ENCOUNTER — Other Ambulatory Visit: Payer: Self-pay | Admitting: Family Medicine

## 2023-09-22 ENCOUNTER — Other Ambulatory Visit: Payer: Self-pay | Admitting: Family Medicine

## 2023-09-22 DIAGNOSIS — K219 Gastro-esophageal reflux disease without esophagitis: Secondary | ICD-10-CM

## 2023-10-24 ENCOUNTER — Other Ambulatory Visit: Payer: Self-pay | Admitting: Family Medicine

## 2023-10-24 DIAGNOSIS — K219 Gastro-esophageal reflux disease without esophagitis: Secondary | ICD-10-CM

## 2024-01-20 ENCOUNTER — Other Ambulatory Visit: Payer: Self-pay | Admitting: Family Medicine

## 2024-01-20 DIAGNOSIS — K219 Gastro-esophageal reflux disease without esophagitis: Secondary | ICD-10-CM
